# Patient Record
Sex: Female | Born: 1945 | Race: White | Hispanic: No | Smoking: Never smoker
Health system: Southern US, Community
[De-identification: ages and names within clinical notes are randomized; demographics above are authoritative.]

## PROBLEM LIST (undated history)

## (undated) DIAGNOSIS — T7840XA Allergy, unspecified, initial encounter: Secondary | ICD-10-CM

## (undated) DIAGNOSIS — L570 Actinic keratosis: Secondary | ICD-10-CM

## (undated) DIAGNOSIS — K2960 Other gastritis without bleeding: Secondary | ICD-10-CM

## (undated) DIAGNOSIS — M1990 Unspecified osteoarthritis, unspecified site: Secondary | ICD-10-CM

## (undated) DIAGNOSIS — K219 Gastro-esophageal reflux disease without esophagitis: Secondary | ICD-10-CM

## (undated) DIAGNOSIS — E785 Hyperlipidemia, unspecified: Secondary | ICD-10-CM

## (undated) HISTORY — DX: Gastro-esophageal reflux disease without esophagitis: K21.9

## (undated) HISTORY — DX: Allergy, unspecified, initial encounter: T78.40XA

## (undated) HISTORY — DX: Unspecified osteoarthritis, unspecified site: M19.90

## (undated) HISTORY — DX: Actinic keratosis: L57.0

## (undated) HISTORY — DX: Other gastritis without bleeding: K29.60

## (undated) HISTORY — DX: Hyperlipidemia, unspecified: E78.5

## (undated) HISTORY — PX: EYE SURGERY: SHX253

---

## 2011-08-10 DIAGNOSIS — E559 Vitamin D deficiency, unspecified: Secondary | ICD-10-CM | POA: Insufficient documentation

## 2015-06-30 DIAGNOSIS — H8103 Meniere's disease, bilateral: Secondary | ICD-10-CM | POA: Insufficient documentation

## 2015-06-30 DIAGNOSIS — K219 Gastro-esophageal reflux disease without esophagitis: Secondary | ICD-10-CM | POA: Insufficient documentation

## 2015-06-30 DIAGNOSIS — F418 Other specified anxiety disorders: Secondary | ICD-10-CM | POA: Insufficient documentation

## 2015-06-30 HISTORY — DX: Other specified anxiety disorders: F41.8

## 2016-08-06 DIAGNOSIS — J4520 Mild intermittent asthma, uncomplicated: Secondary | ICD-10-CM | POA: Insufficient documentation

## 2016-09-17 DIAGNOSIS — E7800 Pure hypercholesterolemia, unspecified: Secondary | ICD-10-CM | POA: Insufficient documentation

## 2017-02-10 DIAGNOSIS — M65841 Other synovitis and tenosynovitis, right hand: Secondary | ICD-10-CM | POA: Insufficient documentation

## 2018-10-22 ENCOUNTER — Other Ambulatory Visit: Payer: Self-pay | Admitting: Nurse Practitioner

## 2018-10-22 DIAGNOSIS — Z1231 Encounter for screening mammogram for malignant neoplasm of breast: Secondary | ICD-10-CM

## 2018-10-28 ENCOUNTER — Other Ambulatory Visit: Payer: Self-pay

## 2018-10-28 ENCOUNTER — Ambulatory Visit
Admission: RE | Admit: 2018-10-28 | Discharge: 2018-10-28 | Disposition: A | Discharge status: Home / Self Care | Payer: Medicare Other | Source: Ambulatory Visit | Attending: Nurse Practitioner | Admitting: Nurse Practitioner

## 2018-10-28 DIAGNOSIS — Z1231 Encounter for screening mammogram for malignant neoplasm of breast: Secondary | ICD-10-CM | POA: Diagnosis present

## 2019-03-17 DIAGNOSIS — H905 Unspecified sensorineural hearing loss: Secondary | ICD-10-CM | POA: Insufficient documentation

## 2019-03-17 DIAGNOSIS — H8109 Meniere's disease, unspecified ear: Secondary | ICD-10-CM | POA: Insufficient documentation

## 2019-07-29 ENCOUNTER — Ambulatory Visit (INDEPENDENT_AMBULATORY_CARE_PROVIDER_SITE_OTHER): Payer: Self-pay | Admitting: Dermatology

## 2019-07-29 ENCOUNTER — Other Ambulatory Visit: Payer: Self-pay

## 2019-07-29 DIAGNOSIS — L988 Other specified disorders of the skin and subcutaneous tissue: Secondary | ICD-10-CM

## 2019-07-29 NOTE — Progress Notes (Signed)
   New Patient Visit  Subjective  Shawna Peterson is a 74 y.o. female who presents for the following: Facial Elastosis (patient would like to discuss mid face, and nasolabial crease fillers, and Botox. She has had fillers and Botox many times in the past and would like to establish care at a practice closer to her home. She was last injected with fillers and Botox in August 2020, but is unsure of the names of the products used. She also had a face lift about 9 years ago at the same practice in [place] that did her Botox and fillers.). Patient has had a face lift about 15 years ago by a Psychiatric nurse.   Objective  Well appearing patient in no apparent distress; mood and affect are within normal limits.  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  rhydites of crows feet and frown complex accentuated by facial expression.   Crepey skin of face.   Assessment & Plan  Elastosis of skin Face   Recommend and Discussed:  20 units of Botox in the frown complex 7.5 units each side for the crow's feet 4.0 units each side for the DAO's   For a total of 43 units. Advised patient of cost of $559.00. Will need to re-inject every 3-4 months for optimal results.  Mid face - recommend starting with 1 syringe of Voluma to the B/L mid face, and following up in a few months to see if another syringe is needed.   Oral commisures and nasolabial - recommend Restylane Refyne filler.  Creping of the skin on the face - recommend Halo laser treatment and BBL (pamphlet given for each).  Botox Injection - Face Location: See attached image  Informed consent: Discussed risks (infection, pain, bleeding, bruising, swelling, allergic reaction, paralysis of nearby muscles, eyelid droop, double vision, neck weakness, difficulty breathing, headache, undesirable cosmetic result, and need for additional treatment) and benefits of the procedure, as well as the alternatives.   Informed consent was obtained.  Preparation: The area was cleansed with alcohol.  Procedure Details:  Botox was injected into the dermis with a 30-gauge needle. Pressure applied to any bleeding.   Lot Number:  [id number] Expiration:  04/2022  Total Units Injected:  43  Plan: Patient was instructed to remain upright for 4 hours. Patient was instructed to avoid massaging the face and avoid vigorous exercise for the rest of the day. Tylenol may be used for headache.  Allow 2 weeks before returning to clinic for additional dosing as needed. Patient will call for any problems.   Return in about 1 month (around 08/29/2019) for fillers, recheck Botox.

## 2019-07-29 NOTE — Progress Notes (Signed)
   New Patient Visit  Subjective  Shawna Peterson is a 74 y.o. female who presents for the following: Facial Elastosis (patient would like to discuss mid face, and nasolabial crease fillers, and Botox. She has had fillers and Botox many times in the past and would like to establish care at a practice closer to her home. She was last injected with fillers and Botox in August 2020, but is unsure of the names of the products used. She also had a face lift about 9 years ago at the same practice in [place] that did her Botox and fillers.).  Objective  Well appearing patient in no apparent distress; mood and affect are within normal limits.  A focused examination was performed including face. Relevant physical exam findings are noted in the Assessment and Plan.  Objective  Face: Rhytides and volume loss.   Assessment & Plan  Elastosis of skin Face   Recommend and Discussed:  20 units of Botox in the frown complex 7.5 units each side for the crow's feet 4.0 units each side for the DAO's   For a total of 43 units. Advised patient of cost of $559.00. Will need to re-inject every 3-4 months for optimal results.  Mid face - recommend starting with 1 syringe of Voluma to the B/L mid face, and following up in a few months to see if another syringe is needed.   Oral commisures and nasolabial - recommend Restylane Refyne filler.  Creping of the skin on the face - recommend Halo laser treatment and BBL (pamphlet given for each).  Botox Injection - Face Location: See attached image  Informed consent: Discussed risks (infection, pain, bleeding, bruising, swelling, allergic reaction, paralysis of nearby muscles, eyelid droop, double vision, neck weakness, difficulty breathing, headache, undesirable cosmetic result, and need for additional treatment) and benefits of the procedure, as well as the alternatives.  Informed consent was obtained.  Preparation: The area was cleansed with  alcohol.  Procedure Details:  Botox was injected into the dermis with a 30-gauge needle. Pressure applied to any bleeding.   Lot Number:  [id number] Expiration:  04/2022  Total Units Injected:  43  Plan: Patient was instructed to remain upright for 4 hours. Patient was instructed to avoid massaging the face and avoid vigorous exercise for the rest of the day. Tylenol may be used for headache.  Allow 2 weeks before returning to clinic for additional dosing as needed. Patient will call for any problems.   Return in about 1 month (around 08/29/2019) for fillers, recheck Botox.   Tanya Nones, CMA, am acting as scribe for Sarina Ser, MD .

## 2019-08-11 ENCOUNTER — Ambulatory Visit: Payer: Medicare Other

## 2019-08-26 ENCOUNTER — Ambulatory Visit: Payer: Medicare Other | Admitting: Dermatology

## 2019-09-24 ENCOUNTER — Other Ambulatory Visit: Payer: Self-pay

## 2019-09-24 ENCOUNTER — Ambulatory Visit (INDEPENDENT_AMBULATORY_CARE_PROVIDER_SITE_OTHER): Payer: Medicare Other | Admitting: Dermatology

## 2019-09-24 DIAGNOSIS — D229 Melanocytic nevi, unspecified: Secondary | ICD-10-CM | POA: Diagnosis not present

## 2019-09-24 DIAGNOSIS — Z1283 Encounter for screening for malignant neoplasm of skin: Secondary | ICD-10-CM | POA: Diagnosis not present

## 2019-09-24 DIAGNOSIS — L304 Erythema intertrigo: Secondary | ICD-10-CM

## 2019-09-24 DIAGNOSIS — D1801 Hemangioma of skin and subcutaneous tissue: Secondary | ICD-10-CM

## 2019-09-24 DIAGNOSIS — L570 Actinic keratosis: Secondary | ICD-10-CM | POA: Diagnosis not present

## 2019-09-24 DIAGNOSIS — L820 Inflamed seborrheic keratosis: Secondary | ICD-10-CM

## 2019-09-24 DIAGNOSIS — L821 Other seborrheic keratosis: Secondary | ICD-10-CM

## 2019-09-24 DIAGNOSIS — L814 Other melanin hyperpigmentation: Secondary | ICD-10-CM

## 2019-09-24 DIAGNOSIS — L578 Other skin changes due to chronic exposure to nonionizing radiation: Secondary | ICD-10-CM

## 2019-09-24 NOTE — Patient Instructions (Addendum)

## 2019-09-24 NOTE — Progress Notes (Signed)
Follow-Up Visit   Subjective  Shawna Peterson is a 74 y.o. female who presents for the following: Mole check (Total body skin exam, no hx of skin ca), check growths (face, neck, itchy and irritated my jewelry), and growths (under breast, uses otc HC). Patient presents for total body skin exam for skin cancer screening and mole check.  The following portions of the chart were reviewed this encounter and updated as appropriate:  Allergies  Meds  Problems  Med Hx  Surg Hx  Fam Hx      Review of Systems:  No other skin or systemic complaints except as noted in HPI or Assessment and Plan.  Objective  Well appearing patient in no apparent distress; mood and affect are within normal limits.  A full examination was performed including scalp, head, eyes, ears, nose, lips, neck, chest, axillae, abdomen, back, buttocks, bilateral upper extremities, bilateral lower extremities, hands, feet, fingers, toes, fingernails, and toenails. All findings within normal limits unless otherwise noted below.  Objective  R cheek x 2 (2): Pink scaly macules   Objective  L cheek/Neck/chest/back/R breast/scalp x 20 (20): Erythematous keratotic or waxy stuck-on papule or plaque.   Objective  inframammary: Pinkness    Assessment & Plan  AK (actinic keratosis) (2) R cheek x 2  Destruction of lesion - R cheek x 2 Complexity: simple   Destruction method: cryotherapy   Informed consent: discussed and consent obtained   Timeout:  patient name, date of birth, surgical site, and procedure verified Lesion destroyed using liquid nitrogen: Yes   Region frozen until ice ball extended beyond lesion: Yes   Outcome: patient tolerated procedure well with no complications   Post-procedure details: wound care instructions given    Inflamed seborrheic keratosis (20) L cheek/Neck/chest/back/R breast/scalp x 20  Destruction of lesion - L cheek/Neck/chest/back/R breast/scalp x 20 Complexity: simple     Destruction method: cryotherapy   Informed consent: discussed and consent obtained   Timeout:  patient name, date of birth, surgical site, and procedure verified Lesion destroyed using liquid nitrogen: Yes   Region frozen until ice ball extended beyond lesion: Yes   Outcome: patient tolerated procedure well with no complications   Post-procedure details: wound care instructions given    Intertrigo inframammary  Cont OTC HC cream qd up to 4d/wk prn flares  Skin cancer screening   Lentigines - Scattered tan macules - Discussed due to sun exposure - Benign, observe - Call for any changes  Seborrheic Keratoses - Stuck-on, waxy, tan-brown papules and plaques  - Discussed benign etiology and prognosis. - Observe - Call for any changes - Discussed cosmetic treatment for arms, Ln2 and chemical peels  Melanocytic Nevi - Tan-brown and/or pink-flesh-colored symmetric macules and papules - Benign appearing on exam today - Observation - Call clinic for new or changing moles - Recommend daily use of broad spectrum spf 30+ sunscreen to sun-exposed areas.   Hemangiomas - Red papules - Discussed benign nature - Observe - Call for any changes  Actinic Damage - diffuse scaly erythematous macules with underlying dyspigmentation - Recommend daily broad spectrum sunscreen SPF 30+ to sun-exposed areas, reapply every 2 hours as needed.  - Call for new or changing lesions.  Skin cancer screening performed today.   Return in about 2 months (around 11/24/2019) for recheck AKs, ISKs and Botox.   I, Othelia Pulling, RMA, am acting as scribe for Sarina Ser, MD .  Documentation: I have reviewed the above documentation for accuracy and completeness,  and I agree with the above.  Sarina Ser, MD

## 2019-09-27 ENCOUNTER — Encounter: Payer: Self-pay | Admitting: Dermatology

## 2019-12-01 ENCOUNTER — Ambulatory Visit: Payer: Medicare Other | Admitting: Dermatology

## 2020-02-03 DIAGNOSIS — K824 Cholesterolosis of gallbladder: Secondary | ICD-10-CM | POA: Insufficient documentation

## 2020-02-03 DIAGNOSIS — K862 Cyst of pancreas: Secondary | ICD-10-CM | POA: Insufficient documentation

## 2020-05-13 ENCOUNTER — Other Ambulatory Visit: Payer: Self-pay

## 2020-05-13 ENCOUNTER — Ambulatory Visit (LOCAL_COMMUNITY_HEALTH_CENTER): Payer: Self-pay

## 2020-05-13 DIAGNOSIS — Z111 Encounter for screening for respiratory tuberculosis: Secondary | ICD-10-CM

## 2020-05-16 ENCOUNTER — Ambulatory Visit (LOCAL_COMMUNITY_HEALTH_CENTER): Payer: Medicare Other

## 2020-05-16 ENCOUNTER — Other Ambulatory Visit: Payer: Self-pay

## 2020-05-16 DIAGNOSIS — Z111 Encounter for screening for respiratory tuberculosis: Secondary | ICD-10-CM

## 2020-05-16 LAB — TB SKIN TEST
Induration: 0 mm
TB Skin Test: NEGATIVE

## 2020-07-12 LAB — COLOGUARD: COLOGUARD: NEGATIVE

## 2020-07-29 ENCOUNTER — Ambulatory Visit: Payer: Self-pay | Admitting: Internal Medicine

## 2020-10-12 ENCOUNTER — Ambulatory Visit: Payer: Medicare Other | Admitting: Internal Medicine

## 2020-11-02 ENCOUNTER — Other Ambulatory Visit: Payer: Self-pay | Admitting: Internal Medicine

## 2020-11-02 DIAGNOSIS — Z1231 Encounter for screening mammogram for malignant neoplasm of breast: Secondary | ICD-10-CM

## 2020-11-04 ENCOUNTER — Other Ambulatory Visit (HOSPITAL_COMMUNITY): Payer: Self-pay | Admitting: Internal Medicine

## 2020-11-16 ENCOUNTER — Ambulatory Visit: Payer: Medicare Other

## 2020-11-24 ENCOUNTER — Other Ambulatory Visit: Payer: Self-pay

## 2020-11-24 ENCOUNTER — Ambulatory Visit
Admission: RE | Admit: 2020-11-24 | Discharge: 2020-11-24 | Disposition: A | Discharge status: Home / Self Care | Payer: Medicare Other | Source: Ambulatory Visit | Attending: Internal Medicine | Admitting: Internal Medicine

## 2020-11-24 DIAGNOSIS — I700 Atherosclerosis of aorta: Secondary | ICD-10-CM | POA: Insufficient documentation

## 2020-11-24 DIAGNOSIS — I2510 Atherosclerotic heart disease of native coronary artery without angina pectoris: Secondary | ICD-10-CM | POA: Insufficient documentation

## 2020-11-24 DIAGNOSIS — R911 Solitary pulmonary nodule: Secondary | ICD-10-CM | POA: Insufficient documentation

## 2020-11-30 DIAGNOSIS — I700 Atherosclerosis of aorta: Secondary | ICD-10-CM | POA: Insufficient documentation

## 2020-11-30 DIAGNOSIS — I2510 Atherosclerotic heart disease of native coronary artery without angina pectoris: Secondary | ICD-10-CM | POA: Insufficient documentation

## 2021-02-21 ENCOUNTER — Other Ambulatory Visit: Payer: Self-pay

## 2021-02-21 ENCOUNTER — Ambulatory Visit
Admission: RE | Admit: 2021-02-21 | Discharge: 2021-02-21 | Disposition: A | Discharge status: Home / Self Care | Payer: Medicare Other | Source: Ambulatory Visit | Attending: Internal Medicine | Admitting: Internal Medicine

## 2021-02-21 DIAGNOSIS — Z1231 Encounter for screening mammogram for malignant neoplasm of breast: Secondary | ICD-10-CM | POA: Insufficient documentation

## 2021-06-03 ENCOUNTER — Ambulatory Visit (INDEPENDENT_AMBULATORY_CARE_PROVIDER_SITE_OTHER)
Admission: RE | Admit: 2021-06-03 | Discharge: 2021-06-03 | Disposition: A | Discharge status: Home / Self Care | Payer: Medicare Other | Source: Ambulatory Visit | Attending: Emergency Medicine | Admitting: Emergency Medicine

## 2021-06-03 ENCOUNTER — Other Ambulatory Visit: Payer: Self-pay

## 2021-06-03 ENCOUNTER — Ambulatory Visit
Admission: RE | Admit: 2021-06-03 | Discharge: 2021-06-03 | Disposition: A | Discharge status: Home / Self Care | Payer: Medicare Other | Source: Ambulatory Visit

## 2021-06-03 VITALS — BP 128/70 | HR 107 | Temp 98.0°F

## 2021-06-03 DIAGNOSIS — K529 Noninfective gastroenteritis and colitis, unspecified: Secondary | ICD-10-CM | POA: Diagnosis not present

## 2021-06-03 DIAGNOSIS — J069 Acute upper respiratory infection, unspecified: Secondary | ICD-10-CM

## 2021-06-03 DIAGNOSIS — R109 Unspecified abdominal pain: Secondary | ICD-10-CM

## 2021-06-03 LAB — COMPREHENSIVE METABOLIC PANEL
ALT: 16 U/L (ref 0–44)
AST: 19 U/L (ref 15–41)
Albumin: 4 g/dL (ref 3.5–5.0)
Alkaline Phosphatase: 72 U/L (ref 38–126)
Anion gap: 10 (ref 5–15)
BUN: 17 mg/dL (ref 8–23)
CO2: 22 mmol/L (ref 22–32)
Calcium: 9.2 mg/dL (ref 8.9–10.3)
Chloride: 104 mmol/L (ref 98–111)
Creatinine, Ser: 0.72 mg/dL (ref 0.44–1.00)
GFR, Estimated: >60 mL/min (ref >60)
Glucose, Bld: 107 mg/dL — ABNORMAL HIGH (ref 70–99)
Potassium: 3.7 mmol/L (ref 3.5–5.1)
Sodium: 136 mmol/L (ref 135–145)
Total Bilirubin: 0.8 mg/dL (ref 0.3–1.2)
Total Protein: 7.3 g/dL (ref 6.5–8.1)

## 2021-06-03 LAB — CBC WITH DIFFERENTIAL/PLATELET
Abs Immature Granulocytes: 0.01 10*3/uL (ref 0.00–0.07)
Basophils Absolute: 0 10*3/uL (ref 0.0–0.1)
Basophils Relative: 0 %
Eosinophils Absolute: 0.2 10*3/uL (ref 0.0–0.5)
Eosinophils Relative: 4 %
HCT: 41.5 % (ref 36.0–46.0)
Hemoglobin: 14.8 g/dL (ref 12.0–15.0)
Immature Granulocytes: 0 %
Lymphocytes Relative: 23 %
Lymphs Abs: 1.1 10*3/uL (ref 0.7–4.0)
MCH: 33.3 pg (ref 26.0–34.0)
MCHC: 35.7 g/dL (ref 30.0–36.0)
MCV: 93.5 fL (ref 80.0–100.0)
Monocytes Absolute: 0.7 10*3/uL (ref 0.1–1.0)
Monocytes Relative: 13 %
Neutro Abs: 2.9 10*3/uL (ref 1.7–7.7)
Neutrophils Relative %: 60 %
Platelets: 241 10*3/uL (ref 150–400)
RBC: 4.44 MIL/uL (ref 3.87–5.11)
RDW: 12.1 % (ref 11.5–15.5)
WBC: 4.9 10*3/uL (ref 4.0–10.5)
nRBC: 0 % (ref 0.0–0.2)

## 2021-06-03 LAB — LIPASE, BLOOD: Lipase: 33 U/L (ref 11–51)

## 2021-06-03 MED ORDER — ONDANSETRON 8 MG PO TBDP: 8.0000 mg | ORAL_TABLET | Freq: Three times a day (TID) | ORAL | 0 refills | Status: DC | PRN

## 2021-06-03 MED ORDER — DICYCLOMINE HCL 20 MG PO TABS: 20.0000 mg | ORAL_TABLET | Freq: Two times a day (BID) | ORAL | 0 refills | Status: DC

## 2021-06-03 MED ORDER — IPRATROPIUM BROMIDE 0.06 % NA SOLN: 2.0000 | Freq: Four times a day (QID) | NASAL | 12 refills | Status: AC

## 2021-06-03 NOTE — ED Triage Notes (Signed)
Pt c/o diarrhea for 3 days. She has been taking imodium with some relief. Feels nauseated after eating. Pt also c/o sinus congestion with thick green phelgm x 1 week.

## 2021-06-03 NOTE — ED Provider Notes (Signed)
[facility]    CSN: [id number] Arrival date & time: 06/03/21  0947      History   Chief Complaint Chief Complaint  Patient presents with   Diarrhea    HPI Shawna Peterson is a 76 y.o. female.   HPI  28 old female here for evaluation of multiple complaints.  Patient's prime complaint is that she has been experiencing diarrhea for the past 3 days that is associated with left upper quadrant pain.  She states that when her diarrhea started she had 7-8 stools that were yellow liquid.  She denies any blood in her stools.  The last 2 days were 4 stools a day of the same consistency and variety.  Today she has not had any stools.  She has been using Imodium at home to help alleviate symptoms.  She is a retired Marine scientist.  Additionally, for the past week she has been experiencing sinus congestion with green postnasal drip.  She is also been experiencing left ear pain.  Patient denies any fever or vomiting.  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  She also a cyst on her liver at that time.  Her skin is due to be free done for further evaluation.  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  She does have her gallbladder still.  She states that the pain increases with eating but she is had a decreased appetite.  The pain increases in the left upper quadrant.  Patient states that she does drink wine but she has not consumed any recently due to her pain.  History reviewed. No pertinent past medical history.  There are no problems to display for this patient.   History reviewed. No pertinent surgical history.  OB History   No obstetric history on file.      Home Medications    Prior to Admission medications   Medication Sig Start Date End Date Taking? Authorizing Provider  dicyclomine (BENTYL) 20 MG tablet Take 1 tablet (20 mg total) by mouth 2 (two) times daily.  06/03/21  Yes Margarette Canada, NP  ipratropium (ATROVENT) 0.06 % nasal spray Place 2 sprays into both nostrils 4 (four) times daily. 06/03/21  Yes Margarette Canada, NP  lansoprazole (PREVACID) 30 MG capsule Take 30 mg by mouth daily at 12 noon.   Yes [provider]  ondansetron (ZOFRAN-ODT) 8 MG disintegrating tablet Take 1 tablet (8 mg total) by mouth every 8 (eight) hours as needed for nausea or vomiting. 06/03/21  Yes Margarette Canada, NP  diphenhydrAMINE (BENADRYL ALLERGY) 25 mg capsule Benadryl    [provider]  fexofenadine (ALLEGRA ALLERGY) 180 MG tablet Allegra Allergy    [provider]  fluticasone (FLONASE) 50 MCG/ACT nasal spray Flonase    [provider]  Loratadine (CLARITIN) 10 MG CAPS Claritin    [provider]  Multiple Vitamin (MULTIVITAMIN) tablet Take 1 tablet by mouth daily.    [provider]  valACYclovir (VALTREX) 500 MG tablet Take 500 mg by mouth 2 (two) times daily.    [provider]    Family History Family History  Problem Relation Age of Onset   Breast cancer Maternal Aunt     Social History Social History   Tobacco Use   Smoking status: Never   Smokeless tobacco: Never  Vaping Use   Vaping Use: Never used  Substance Use Topics  Alcohol use: Yes   Drug use: Never     Allergies   Lorazepam   Review of Systems Review of Systems  Constitutional:  Positive for appetite change. Negative for fever.  HENT:  Positive for congestion, ear pain, postnasal drip and sinus pressure. Negative for ear discharge and sore throat.   Respiratory:  Negative for cough.   Gastrointestinal:  Positive for abdominal pain, diarrhea and nausea. Negative for blood in stool and vomiting.  Skin:  Negative for color change and rash.  Hematological: Negative.   Psychiatric/Behavioral: Negative.      Physical Exam Triage Vital Signs ED Triage Vitals  Enc Vitals Group     BP 06/03/21 0956 128/70     Pulse Rate  06/03/21 0956 (!) 107     Resp --      Temp 06/03/21 0956 98 F (36.7 C)     Temp Source 06/03/21 0956 Oral     SpO2 06/03/21 0956 100 %     Weight --      Height --      Head Circumference --      Peak Flow --      Pain Score 06/03/21 0957 2     Pain Loc --      Pain Edu? --      Excl. in [place]? --    No data found.  Updated Vital Signs BP 128/70 (BP Location: Left Arm)    Pulse (!) 107    Temp 98 F (36.7 C) (Oral)    SpO2 100%   Visual Acuity Right Eye Distance:   Left Eye Distance:   Bilateral Distance:    Right Eye Near:   Left Eye Near:    Bilateral Near:     Physical Exam Vitals and nursing note reviewed.  Constitutional:      General: She is not in acute distress.    Appearance: Normal appearance. She is not ill-appearing.  HENT:     Head: Normocephalic and atraumatic.     Right Ear: Tympanic membrane, ear canal and external ear normal. There is no impacted cerumen.     Left Ear: Tympanic membrane, ear canal and external ear normal. There is no impacted cerumen.     Nose: Congestion present. No rhinorrhea.     Mouth/Throat:     Mouth: Mucous membranes are moist.     Pharynx: Oropharynx is clear. No posterior oropharyngeal erythema.  Cardiovascular:     Rate and Rhythm: Normal rate and regular rhythm.     Pulses: Normal pulses.     Heart sounds: Normal heart sounds. No murmur heard.   No friction rub. No gallop.  Pulmonary:     Effort: Pulmonary effort is normal.     Breath sounds: Normal breath sounds. No wheezing, rhonchi or rales.  Abdominal:     General: Abdomen is flat.     Palpations: Abdomen is soft.     Tenderness: There is abdominal tenderness. There is no guarding or rebound.  Musculoskeletal:     Cervical back: Normal range of motion and neck supple.  Lymphadenopathy:     Cervical: No cervical adenopathy.  Skin:    General: Skin is warm and dry.     Capillary Refill: Capillary refill takes less than 2 seconds.     Coloration: Skin is not  jaundiced.  Neurological:     General: No focal deficit present.     Mental Status: She is alert and oriented to person, place, and time.  Psychiatric:        Mood and Affect: Mood normal.        Behavior: Behavior normal.        Thought Content: Thought content normal.        Judgment: Judgment normal.     UC Treatments / Results  Labs (all labs ordered are listed, but only abnormal results are displayed) Labs Reviewed  COMPREHENSIVE METABOLIC PANEL - Abnormal; Notable for the following components:      Result Value   Glucose, Bld 107 (*)    All other components within normal limits  CBC WITH DIFFERENTIAL/PLATELET  LIPASE, BLOOD    EKG   Radiology CT ABDOMEN PELVIS WO CONTRAST  Result Date: 06/03/2021 CLINICAL DATA:  Concern for pancreatitis EXAM: CT ABDOMEN AND PELVIS WITHOUT CONTRAST TECHNIQUE: Multidetector CT imaging of the abdomen and pelvis was performed following the standard protocol without IV contrast. RADIATION DOSE REDUCTION: This exam was performed according to the departmental dose-optimization program which includes automated exposure control, adjustment of the mA and/or kV according to patient size and/or use of iterative reconstruction technique. COMPARISON:  None. FINDINGS: Lower chest: No acute abnormality. Hepatobiliary: Hypodense 5 mm lesion left lobe of the liver on image 21/2 is technically too small to accurately characterize and incompletely evaluated without intravenous contrast material but statistically likely to reflect a benign etiology such as a cyst or hemangioma. Gallbladder is unremarkable. No biliary ductal dilation. Pancreas: No pancreatic ductal dilation or evidence of acute inflammation. Spleen: Normal in size without focal abnormality. Adrenals/Urinary Tract: Bilateral adrenal glands appear normal. Hypodense 8 mm right upper pole renal lesion is too small to accurately characterize and incompletely evaluated without intravenous contrast material  but statistically likely reflect cysts. No hydronephrosis. No renal, ureteral or bladder calculi identified. Urinary bladder is decompressed limiting evaluation. Stomach/Bowel: No enteric contrast was administered. Stomach is distended with ingested material without wall thickening. No pathologic dilation of small or large bowel. The appendix and terminal ileum appear normal. No evidence of acute bowel inflammation. No suspicious colonic wall thickening or mass like lesions visualized. No evidence of acute bowel inflammation. Vascular/Lymphatic: Aortic atherosclerosis without aneurysmal dilation. No pathologically enlarged abdominal or pelvic lymph nodes. Reproductive: Uterus and bilateral adnexa are unremarkable. Other: No significant abdominopelvic free fluid. Musculoskeletal: Multilevel degenerative changes spine. No acute osseous abnormality. IMPRESSION: 1. No acute abdominopelvic findings. Specifically, no CT evidence of pancreatitis. 2.  Aortic Atherosclerosis (ICD10-I70.0). Electronically Signed   By: Dahlia Bailiff M.D.   On: 06/03/2021 11:09    Procedures Procedures (including critical care time)  Medications Ordered in UC Medications - No data to display  Initial Impression / Assessment and Plan / UC Course  I have reviewed the triage vital signs and the nursing notes.  Pertinent labs & imaging results that were available during my care of the patient were reviewed by me and considered in my medical decision making (see chart for details).  Patient is a nontoxic-appearing 64 old female here for evaluation of respiratory and abdominal complaints as outlined HPI above.  Her more pressing complaint is her left upper quadrant pain and associated diarrhea.  The diarrhea seems to have resolved and she has not had a stool today but the pain remains.  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  She does not have any yellowing of her skin or  yellowing of her eyes.  She states that the pain does increase  with eating and increases in the left upper quadrant.  Patient is a concern for pancreatitis because she also has high cholesterol and cholesterol ptosis of her gallbladder.  She does consume wine but has not consumed any due to feeling bad.  Her other symptoms are sinus congestion, green postnasal drip that been present for the past week.  She has been taking Imodium at home and using Gatorade for electrolyte replacement.  Her physical exam reveals pearly-gray tympanic membranes bilaterally with normal light reflex and clear external auditory canals.  Nasal mucosa is mildly erythematous without significant edema.  There is dried green discharge in both nares.  Patient does have marked tenderness of bilateral maxillary sinuses to percussion.  Oropharyngeal exam is benign.  No cervical lymphadenopathy appreciated exam.  Cardiopulmonary exam reveals clear lung sounds in all fields.  Abdomen is soft and flat with significant tenderness in the epigastric and left upper quadrant.  Patient does have some mild tenderness in the left lower quadrant as well.  No suprapubic or right lower quadrant pain noted.  No red upper quadrant pain noted.  No guarding or rebound noted on exam.  Differential diagnosis include pancreatitis and diverticulitis.  Suspect patient does have upper respiratory infection, possibly sinusitis given the tenderness and discharge.  Her postnasal drip may be contributing to the diarrhea as well.  Will obtain CBC, CMP, lipase, and CT of the abdomen and pelvis with water contrast per radiology protocol for evaluation of the pancreas.  CBC is unremarkable.  CMP shows a mildly elevated glucose of 107 but otherwise no abnormality of electrolytes, renal function, or transaminases.  Lipase 33.  CT scan of the abdomen is negative for acute abdominal pelvic findings.  No CT evidence of pancreatitis.  Aortic atherosclerosis.  Will discharge  patient home with a diagnosis of gastroenteritis and have her continue the Imodium as needed for diarrhea.  We will also start dicyclomine.  I will encourage her to perform sinus irrigation 45 sinus inflammation and pain.  I have also prescribed Atrovent nasal spray to help with the nasal congestion.  Also prescribe Zofran for nausea.  Final Clinical Impressions(s) / UC Diagnoses   Final diagnoses:  Gastroenteritis  Acute upper respiratory infection     Discharge Instructions      Take the Zofran every 8 hours as needed for nausea and vomiting.  They are an oral disintegrating tablet and you can place them on her under your tongue and then will be absorbed.  Use the Bentyl (dicyclomine) every 6 hours as needed for abdominal cramping.  Follow a clear liquid diet for the next 6 to 12 hours.  Clear liquids consist of broth, ginger ale, water, Pedialyte, and Jell-O.  After 6 to 12 hours, if you are tolerating clear liquids, you can advance to bland foods such as bananas, rice, applesauce, and toast.  If you tolerate bland foods you can continue to advance your diet as you see fit.  If you develop a fever over 100.5, increased abdominal pain, bloody vomit, or bloody stool return for reevaluation or go to the ER.   Perform sinus irrigation 2-3 times a day with a [employer] sinus rinse kit and distilled water.  Do not use tap water.  You can use plain over-the-counter Mucinex every 6 hours to break up the stickiness of the mucus so your body can clear it.  Increase your oral fluid intake to thin out your mucus so that is also able for your body to clear more  easily.  Take an over-the-counter probiotic, such as Culturelle-align-activia, 1 hour after each dose of antibiotic to prevent diarrhea.  If you develop any new or worsening symptoms return for reevaluation or see your primary care provider.      ED Prescriptions     Medication Sig Dispense Auth. Provider   ipratropium (ATROVENT)  0.06 % nasal spray Place 2 sprays into both nostrils 4 (four) times daily. 15 mL Margarette Canada, NP   dicyclomine (BENTYL) 20 MG tablet Take 1 tablet (20 mg total) by mouth 2 (two) times daily. 20 tablet Margarette Canada, NP   ondansetron (ZOFRAN-ODT) 8 MG disintegrating tablet Take 1 tablet (8 mg total) by mouth every 8 (eight) hours as needed for nausea or vomiting. 20 tablet Margarette Canada, NP      PDMP not reviewed this encounter.   Margarette Canada, NP 06/03/21 1119

## 2021-06-03 NOTE — Discharge Instructions (Addendum)
Take the Zofran every 8 hours as needed for nausea and vomiting.  They are an oral disintegrating tablet and you can place them on her under your tongue and then will be absorbed.  Use the Bentyl (dicyclomine) every 6 hours as needed for abdominal cramping.  Follow a clear liquid diet for the next 6 to 12 hours.  Clear liquids consist of broth, ginger ale, water, Pedialyte, and Jell-O.  After 6 to 12 hours, if you are tolerating clear liquids, you can advance to bland foods such as bananas, rice, applesauce, and toast.  If you tolerate bland foods you can continue to advance your diet as you see fit.  If you develop a fever over 100.5, increased abdominal pain, bloody vomit, or bloody stool return for reevaluation or go to the ER.   Perform sinus irrigation 2-3 times a day with a [employer] sinus rinse kit and distilled water.  Do not use tap water.  You can use plain over-the-counter Mucinex every 6 hours to break up the stickiness of the mucus so your body can clear it.  Increase your oral fluid intake to thin out your mucus so that is also able for your body to clear more easily.  Take an over-the-counter probiotic, such as Culturelle-align-activia, 1 hour after each dose of antibiotic to prevent diarrhea.  If you develop any new or worsening symptoms return for reevaluation or see your primary care provider.

## 2021-06-06 ENCOUNTER — Ambulatory Visit (INDEPENDENT_AMBULATORY_CARE_PROVIDER_SITE_OTHER): Payer: Medicare Other | Admitting: Family Medicine

## 2021-06-06 ENCOUNTER — Other Ambulatory Visit: Payer: Self-pay

## 2021-06-06 ENCOUNTER — Encounter: Payer: Self-pay | Admitting: Family Medicine

## 2021-06-06 VITALS — BP 120/60 | HR 80 | Ht 60.0 in | Wt 114.0 lb

## 2021-06-06 DIAGNOSIS — Z7689 Persons encountering health services in other specified circumstances: Secondary | ICD-10-CM

## 2021-06-06 DIAGNOSIS — F5101 Primary insomnia: Secondary | ICD-10-CM | POA: Diagnosis not present

## 2021-06-06 DIAGNOSIS — J0100 Acute maxillary sinusitis, unspecified: Secondary | ICD-10-CM

## 2021-06-06 DIAGNOSIS — E7800 Pure hypercholesterolemia, unspecified: Secondary | ICD-10-CM | POA: Diagnosis not present

## 2021-06-06 DIAGNOSIS — I700 Atherosclerosis of aorta: Secondary | ICD-10-CM

## 2021-06-06 MED ORDER — TRAZODONE HCL 50 MG PO TABS
25.0000 mg | ORAL_TABLET | Freq: Every evening | ORAL | 3 refills | Status: DC | PRN
Start: 2021-06-06 — End: 2021-06-29

## 2021-06-06 MED ORDER — AZITHROMYCIN 250 MG PO TABS: ORAL_TABLET | ORAL | 0 refills | Status: AC

## 2021-06-06 MED ORDER — EZETIMIBE 10 MG PO TABS: 10.0000 mg | ORAL_TABLET | Freq: Every day | ORAL | 2 refills | Status: DC

## 2021-06-06 NOTE — Patient Instructions (Signed)

## 2021-06-06 NOTE — Progress Notes (Signed)
Date:  06/06/2021   Name:  Shawna Peterson   DOB:  [birth year]   MRN:  [id number]   Chief Complaint: [place] and Sinusitis [employer] production)  Patient is a 76 year old female who presents for a comprehensive physical exam. The patient reports the following problems: multiple. Health maintenance has been reviewed up to date    Sinusitis This is a new problem. The current episode started more than 1 month ago. The problem has been waxing and waning since onset. There has been no fever. The pain is moderate. Associated symptoms include congestion, sinus pressure, sneezing and a sore throat. Pertinent negatives include no chills, diaphoresis, ear pain, headaches, hoarse voice, neck pain, shortness of breath or swollen glands. Past treatments include nothing. The treatment provided moderate relief.  Hyperlipidemia This is a chronic problem. The problem is uncontrolled. Recent lipid tests were reviewed and are high. Pertinent negatives include no shortness of breath. Current antihyperlipidemic treatment includes diet change. Compliance problems include medication side effects.    Lab Results  Component Value Date   NA 136 06/03/2021   K 3.7 06/03/2021   CO2 22 06/03/2021   GLUCOSE 107 (H) 06/03/2021   BUN 17 06/03/2021   CREATININE 0.72 06/03/2021   CALCIUM 9.2 06/03/2021   GFRNONAA >60 06/03/2021   No results found for: CHOL, HDL, LDLCALC, LDLDIRECT, TRIG, CHOLHDL No results found for: TSH No results found for: HGBA1C Lab Results  Component Value Date   WBC 4.9 06/03/2021   HGB 14.8 06/03/2021   HCT 41.5 06/03/2021   MCV 93.5 06/03/2021   PLT 241 06/03/2021   Lab Results  Component Value Date   ALT 16 06/03/2021   AST 19 06/03/2021   ALKPHOS 72 06/03/2021   BILITOT 0.8 06/03/2021   No results found for: 25OHVITD2, 25OHVITD3, VD25OH   Review of Systems  Constitutional:  Negative for chills and diaphoresis.  HENT:  Positive for congestion, sinus pressure, sneezing  and sore throat. Negative for ear pain and hoarse voice.   Respiratory:  Negative for shortness of breath.   Musculoskeletal:  Negative for neck pain.  Neurological:  Negative for headaches.   There are no problems to display for this patient.   Allergies  Allergen Reactions   Lorazepam Nausea Only and Other (See Comments)    Other Reaction: Nightmares Other Reaction: Nightmares Other Reaction: Nightmares Other Reaction: Nightmares Other Reaction: Nightmares Other Reaction: Nightmares     No past surgical history on file.  Social History   Tobacco Use   Smoking status: Never   Smokeless tobacco: Never  Vaping Use   Vaping Use: Never used  Substance Use Topics   Alcohol use: Yes    Comment: occasionally   Drug use: Never     Medication list has been reviewed and updated.  Current Meds  Medication Sig   ALPRAZolam (XANAX) 0.25 MG tablet Take 1 tablet by mouth daily.   Cholecalciferol (VITAMIN D3) 125 MCG (5000 UT) CAPS Take 1 capsule by mouth daily.   dicyclomine (BENTYL) 20 MG tablet Take 1 tablet (20 mg total) by mouth 2 (two) times daily.   diphenhydrAMINE (BENADRYL) 25 mg capsule Benadryl   fexofenadine (ALLEGRA) 180 MG tablet Allegra Allergy   fluticasone (FLONASE) 50 MCG/ACT nasal spray Flonase   ipratropium (ATROVENT) 0.06 % nasal spray Place 2 sprays into both nostrils 4 (four) times daily.   lansoprazole (PREVACID) 30 MG capsule Take 30 mg by mouth daily at 12 noon.  Loratadine 10 MG CAPS Claritin   Multiple Vitamin (MULTIVITAMIN) tablet Take 1 tablet by mouth daily.   ondansetron (ZOFRAN-ODT) 8 MG disintegrating tablet Take 1 tablet (8 mg total) by mouth every 8 (eight) hours as needed for nausea or vomiting.   valACYclovir (VALTREX) 500 MG tablet Take 500 mg by mouth 2 (two) times daily.    PHQ 2/9 Scores 06/06/2021  PHQ - 2 Score 1  PHQ- 9 Score 3    GAD 7 : Generalized Anxiety Score 06/06/2021  Nervous, Anxious, on Edge 1  Control/stop worrying 1   Worry too much - different things 1  Trouble relaxing 0  Restless 0  Easily annoyed or irritable 0  Afraid - awful might happen 0  Total GAD 7 Score 3  Anxiety Difficulty Not difficult at all    BP Readings from Last 3 Encounters:  06/06/21 120/60  06/03/21 128/70    Physical Exam Vitals and nursing note reviewed.  Constitutional:      Appearance: She is well-developed.  HENT:     Head: Normocephalic.     Right Ear: Tympanic membrane and external ear normal.     Left Ear: Tympanic membrane and external ear normal.     Nose: Nose normal.  Eyes:     General: Lids are everted, no foreign bodies appreciated. No scleral icterus.       Left eye: No foreign body or hordeolum.     Conjunctiva/sclera: Conjunctivae normal.     Right eye: Right conjunctiva is not injected.     Left eye: Left conjunctiva is not injected.     Pupils: Pupils are equal, round, and reactive to light.  Neck:     Thyroid: No thyromegaly.     Vascular: No JVD.     Trachea: No tracheal deviation.  Cardiovascular:     Rate and Rhythm: Normal rate and regular rhythm.     Heart sounds: Normal heart sounds. No murmur heard.   No friction rub. No gallop.  Pulmonary:     Effort: Pulmonary effort is normal. No respiratory distress.     Breath sounds: Normal breath sounds. No wheezing, rhonchi or rales.  Abdominal:     General: Bowel sounds are normal.     Palpations: Abdomen is soft. There is no mass.     Tenderness: There is no abdominal tenderness. There is no guarding or rebound.  Musculoskeletal:        General: No tenderness. Normal range of motion.     Cervical back: Normal range of motion and neck supple.  Lymphadenopathy:     Cervical: No cervical adenopathy.  Skin:    General: Skin is warm.     Findings: No rash.  Neurological:     Mental Status: She is alert and oriented to person, place, and time.  Psychiatric:        Mood and Affect: Mood is not anxious or depressed.    Wt Readings from  Last 3 Encounters:  06/06/21 114 lb (51.7 kg)    BP 120/60    Pulse 80    Ht 5' (1.524 m)    Wt 114 lb (51.7 kg)    BMI 22.26 kg/m   Assessment and Plan:  1. Establishing care with new doctor, encounter for Patient establishing care with new physician with the following circumstances to be addressed today.  2. Pure hypercholesterolemia Patient has had a history of abnormal cholesterol for which she has been unable to tolerate statins presumably was on  low-dose Crestor?  5 mg daily.  We have discussed using Zetia 10 mg once a day which is not a statin and she would like to give that a try.  Patient will return in 8 weeks fasting at which time we will recheck lipid panel. - ezetimibe (ZETIA) 10 MG tablet; Take 1 tablet (10 mg total) by mouth daily.  Dispense: 30 tablet; Refill: 2  3. Primary insomnia Noted that patient has history of insomnia for which she was taking alprazolam but we would prefer not to continue benzodiazepines for this circumstance.  We are willing to try trazodone 50 mg 1/2 to 1 tablet at bedtime as needed for sleep.  Patient will return in 8 weeks to discuss continuance. - traZODone (DESYREL) 50 MG tablet; Take 0.5-1 tablets (25-50 mg total) by mouth at bedtime as needed for sleep.  Dispense: 30 tablet; Refill: 3  4. 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  5. Acute non-recurrent maxillary sinusitis New onset.  Persistent.  Patient continues to have symptomatology suggestive of sinus infection.  Conservative means have been used but there is no change in his symptomatology and we will now initiate azithromycin to 50 mg 2 today followed by 1 a day for 4 days. - azithromycin (ZITHROMAX) 250 MG tablet; Take 2 tablets on day 1, then 1 tablet daily on days 2 through 5  Dispense: 6 tablet; Refill: 0

## 2021-06-07 ENCOUNTER — Ambulatory Visit: Payer: Medicare Other

## 2021-06-17 ENCOUNTER — Other Ambulatory Visit: Payer: Self-pay | Admitting: Family Medicine

## 2021-06-17 DIAGNOSIS — E7800 Pure hypercholesterolemia, unspecified: Secondary | ICD-10-CM

## 2021-06-19 ENCOUNTER — Other Ambulatory Visit: Payer: Self-pay

## 2021-06-19 ENCOUNTER — Encounter: Payer: Self-pay | Admitting: Family Medicine

## 2021-06-19 DIAGNOSIS — R197 Diarrhea, unspecified: Secondary | ICD-10-CM

## 2021-06-19 MED ORDER — DICYCLOMINE HCL 20 MG PO TABS: 20.0000 mg | ORAL_TABLET | Freq: Two times a day (BID) | ORAL | 0 refills | Status: DC

## 2021-06-28 ENCOUNTER — Other Ambulatory Visit: Payer: Self-pay | Admitting: Family Medicine

## 2021-06-28 DIAGNOSIS — F5101 Primary insomnia: Secondary | ICD-10-CM

## 2021-07-11 ENCOUNTER — Other Ambulatory Visit: Payer: Self-pay | Admitting: Family Medicine

## 2021-07-11 ENCOUNTER — Encounter: Payer: Self-pay | Admitting: Family Medicine

## 2021-07-11 DIAGNOSIS — R197 Diarrhea, unspecified: Secondary | ICD-10-CM

## 2021-07-12 ENCOUNTER — Other Ambulatory Visit: Payer: Self-pay

## 2021-07-12 DIAGNOSIS — R197 Diarrhea, unspecified: Secondary | ICD-10-CM

## 2021-07-12 MED ORDER — DICYCLOMINE HCL 20 MG PO TABS: 20.0000 mg | ORAL_TABLET | Freq: Two times a day (BID) | ORAL | 0 refills | Status: DC

## 2021-07-29 ENCOUNTER — Other Ambulatory Visit: Payer: Self-pay

## 2021-07-29 ENCOUNTER — Encounter: Payer: Self-pay | Admitting: Emergency Medicine

## 2021-07-29 ENCOUNTER — Ambulatory Visit
Admission: EM | Admit: 2021-07-29 | Discharge: 2021-07-29 | Disposition: A | Discharge status: Home / Self Care | Payer: Medicare Other | Attending: Emergency Medicine | Admitting: Emergency Medicine

## 2021-07-29 DIAGNOSIS — N390 Urinary tract infection, site not specified: Secondary | ICD-10-CM | POA: Diagnosis present

## 2021-07-29 LAB — URINALYSIS, ROUTINE W REFLEX MICROSCOPIC
Glucose, UA: NEGATIVE mg/dL
Nitrite: NEGATIVE
Protein, ur: 30 mg/dL — AB
Specific Gravity, Urine: 1.025 (ref 1.005–1.030)
pH: 5.5 (ref 5.0–8.0)

## 2021-07-29 LAB — URINALYSIS, MICROSCOPIC (REFLEX)
RBC / HPF: >50 RBC/hpf (ref 0–5)
WBC, UA: >50 WBC/hpf (ref 0–5)

## 2021-07-29 MED ORDER — CEPHALEXIN 500 MG PO CAPS: 500.0000 mg | ORAL_CAPSULE | Freq: Four times a day (QID) | ORAL | 0 refills | Status: AC

## 2021-07-29 MED ORDER — ONDANSETRON 4 MG PO TBDP: 4.0000 mg | ORAL_TABLET | Freq: Three times a day (TID) | ORAL | 0 refills | Status: DC | PRN

## 2021-07-29 MED ORDER — PHENAZOPYRIDINE HCL 200 MG PO TABS: 200.0000 mg | ORAL_TABLET | Freq: Three times a day (TID) | ORAL | 0 refills | Status: DC

## 2021-07-29 MED ORDER — LOPERAMIDE HCL 2 MG PO TABS: 2.0000 mg | ORAL_TABLET | Freq: Four times a day (QID) | ORAL | 0 refills | Status: DC | PRN

## 2021-07-29 NOTE — ED Provider Notes (Signed)
? ?[facility] ?Provider Note ? ?Patient Contact: 12:42 PM (approximate) ? ? ?History  ? ?Dysuria and Urinary Frequency ? ? ?HPI ? ?Shawna Peterson is a 76 y.o. female who presents emergency department concern for possible UTI.  Patient has suprapubic cramping, dysuria and polyuria.  No flank pain.  No hematuria.  Patient states couple days ago she had some food poisoning, this completely resolved but now she feels like she is starting with a UTI.  No fevers or chills. ?  ? ? ?Physical Exam  ? ?Triage Vital Signs: ?ED Triage Vitals  ?Enc Vitals Group  ?   BP 07/29/21 1217 (!) 140/105  ?   Pulse Rate 07/29/21 1217 (!) 102  ?   Resp 07/29/21 1217 14  ?   Temp 07/29/21 1217 98.3 ?F (36.8 ?C)  ?   Temp Source 07/29/21 1217 Oral  ?   SpO2 07/29/21 1217 100 %  ?   Weight 07/29/21 1215 110 lb (49.9 kg)  ?   Height 07/29/21 1215 5' (1.524 m)  ?   Head Circumference --   ?   Peak Flow --   ?   Pain Score 07/29/21 1214 2  ?   Pain Loc --   ?   Pain Edu? --   ?   Excl. in [place]? --   ? ? ?Most recent vital signs: ?Vitals:  ? 07/29/21 1217 07/29/21 1219  ?BP: (!) 140/105 129/86  ?Pulse: (!) 102   ?Resp: 14   ?Temp: 98.3 ?F (36.8 ?C)   ?SpO2: 100%   ? ? ? ?General: Alert and in no acute distress.  ?Cardiovascular:  Good peripheral perfusion ?Respiratory: Normal respiratory effort without tachypnea or retractions. Lungs CTAB. Good air entry to the bases with no decreased or absent breath sounds. ?Gastrointestinal: Bowel sounds ?4 quadrants. Soft and nontender to palpation. No guarding or rigidity. No palpable masses. No distention. No CVA tenderness. ?Musculoskeletal: Full range of motion to all extremities.  ?Neurologic:  No gross focal neurologic deficits are appreciated.  ?Skin:   No rash noted ?Other: ? ? ?ED Results / Procedures / Treatments  ? ?Labs ?(all labs ordered are listed, but only abnormal results are displayed) ?Labs Reviewed  ?URINALYSIS, ROUTINE W REFLEX MICROSCOPIC - Abnormal; Notable for the  following components:  ?    Result Value  ? APPearance HAZY (*)   ? Hgb urine dipstick LARGE (*)   ? Bilirubin Urine SMALL (*)   ? Ketones, ur TRACE (*)   ? Protein, ur 30 (*)   ? Leukocytes,Ua SMALL (*)   ? All other components within normal limits  ?URINALYSIS, MICROSCOPIC (REFLEX) - Abnormal; Notable for the following components:  ? Bacteria, UA MANY (*)   ? All other components within normal limits  ?URINE CULTURE  ? ? ? ?EKG ? ? ? ? ?RADIOLOGY ? ? ? ?No results found. ? ?PROCEDURES: ? ?Critical Care performed: No ? ?Procedures ? ? ?MEDICATIONS ORDERED IN ED: ?Medications - No data to display ? ? ?IMPRESSION / MDM / ASSESSMENT AND PLAN / ED COURSE  ?I reviewed the triage vital signs and the nursing notes. ?             ?               ? ?Differential diagnosis includes, but is not limited to, UTI, nephrolithiasis, gastroenteritis ? ? ?Patient's diagnosis is consistent with UTI.  Patient presents emergency department dysuria, polyuria.  Symptoms began  this morning.  Patient has a large amount of bacteria that she has no nitrites or leukocytes at this time.  Given the symptoms I will treat for UTI and we will culture the urine at this time.  Follow-up with primary care as needed..  Patient is given ED precautions to return to the ED for any worsening or new symptoms. ? ? ? ?  ? ? ?FINAL CLINICAL IMPRESSION(S) / ED DIAGNOSES  ? ?Final diagnoses:  ?Lower urinary tract infectious disease  ? ? ? ?Rx / DC Orders  ? ?ED Discharge Orders   ? ?      Ordered  ?  cephALEXin (KEFLEX) 500 MG capsule  4 times daily       ? 07/29/21 1246  ?  phenazopyridine (PYRIDIUM) 200 MG tablet  3 times daily       ? 07/29/21 1246  ?  ondansetron (ZOFRAN-ODT) 4 MG disintegrating tablet  Every 8 hours PRN       ? 07/29/21 1246  ?  loperamide (IMODIUM A-D) 2 MG tablet  4 times daily PRN       ? 07/29/21 1246  ? ?  ?  ? ?  ? ? ? ?Note:  This document was prepared using Dragon voice recognition software and may include unintentional dictation  errors. ?  ?Darletta Moll, PA-C ?07/29/21 1247 ? ?

## 2021-07-29 NOTE — ED Triage Notes (Signed)
Patient c/o burning when urinating and urinary frequency that started this morning.   ?

## 2021-07-31 LAB — URINE CULTURE: Culture: >=100000 — AB

## 2021-08-01 ENCOUNTER — Other Ambulatory Visit: Payer: Self-pay

## 2021-08-01 ENCOUNTER — Ambulatory Visit (INDEPENDENT_AMBULATORY_CARE_PROVIDER_SITE_OTHER): Payer: Medicare Other | Admitting: Family Medicine

## 2021-08-01 ENCOUNTER — Encounter: Payer: Self-pay | Admitting: Family Medicine

## 2021-08-01 VITALS — BP 100/60 | HR 82 | Ht 60.0 in | Wt 113.0 lb

## 2021-08-01 DIAGNOSIS — R5383 Other fatigue: Secondary | ICD-10-CM

## 2021-08-01 DIAGNOSIS — K219 Gastro-esophageal reflux disease without esophagitis: Secondary | ICD-10-CM | POA: Diagnosis not present

## 2021-08-01 DIAGNOSIS — F5101 Primary insomnia: Secondary | ICD-10-CM

## 2021-08-01 DIAGNOSIS — K580 Irritable bowel syndrome with diarrhea: Secondary | ICD-10-CM

## 2021-08-01 DIAGNOSIS — E7800 Pure hypercholesterolemia, unspecified: Secondary | ICD-10-CM | POA: Diagnosis not present

## 2021-08-01 DIAGNOSIS — R197 Diarrhea, unspecified: Secondary | ICD-10-CM

## 2021-08-01 DIAGNOSIS — B349 Viral infection, unspecified: Secondary | ICD-10-CM

## 2021-08-01 LAB — HEMOCCULT GUIAC POC 1CARD (OFFICE): Fecal Occult Blood, POC: NEGATIVE

## 2021-08-01 MED ORDER — EZETIMIBE 10 MG PO TABS: 10.0000 mg | ORAL_TABLET | Freq: Every day | ORAL | 1 refills | Status: DC

## 2021-08-01 MED ORDER — OMEPRAZOLE 20 MG PO CPDR: 20.0000 mg | DELAYED_RELEASE_CAPSULE | Freq: Every day | ORAL | 1 refills | Status: DC

## 2021-08-01 MED ORDER — DICYCLOMINE HCL 20 MG PO TABS: 20.0000 mg | ORAL_TABLET | Freq: Two times a day (BID) | ORAL | 0 refills | Status: DC

## 2021-08-01 MED ORDER — ACYCLOVIR 400 MG PO TABS: 400.0000 mg | ORAL_TABLET | Freq: Two times a day (BID) | ORAL | 1 refills | Status: DC

## 2021-08-01 NOTE — Addendum Note (Signed)
Addended by: Fredderick Severance on: 08/01/2021 11:22 AM ? ? Modules accepted: Orders ? ?

## 2021-08-01 NOTE — Progress Notes (Addendum)
Date:  08/01/2021   Name:  Shawna Peterson   DOB:  03-Jun-1945   MRN:  [id number]   Chief Complaint: Insomnia, Hyperlipidemia, and Gastroesophageal Reflux  Insomnia Primary symptoms: no fragmented sleep, no sleep disturbance, no difficulty falling asleep, no somnolence, no frequent awakening, no premature morning awakening, no malaise/fatigue, no napping.   The current episode started more than one year. The onset quality is gradual. The problem occurs intermittently. The problem has been resolved since onset. Past treatments include medication. The treatment provided significant relief.  Hyperlipidemia This is a chronic problem. The current episode started more than 1 year ago. The problem is controlled. Recent lipid tests were reviewed and are normal. She has no history of chronic renal disease, diabetes, hypothyroidism, liver disease, obesity or nephrotic syndrome. Pertinent negatives include no chest pain, myalgias or shortness of breath. Current antihyperlipidemic treatment includes ezetimibe. The current treatment provides moderate improvement of lipids. There are no compliance problems.  Risk factors for coronary artery disease include post-menopausal and hypertension.  Gastroesophageal Reflux She reports no abdominal pain, no belching, no chest pain, no choking, no coughing, no dysphagia, no early satiety, no globus sensation, no heartburn, no hoarse voice, no nausea, no sore throat, no stridor, no tooth decay, no water brash or no wheezing. This is a chronic problem. The current episode started more than 1 year ago. Pertinent negatives include no fatigue or weight loss.  Thyroid Problem Presents for follow-up visit. Symptoms include cold intolerance. Patient reports no anxiety, constipation, depressed mood, diaphoresis, diarrhea, dry skin, fatigue, hair loss, heat intolerance, hoarse voice, leg swelling, menstrual problem, nail problem, palpitations, tremors, visual change, weight gain or  weight loss. The symptoms have been stable. Her past medical history is significant for hyperlipidemia. There is no history of diabetes.   Lab Results  Component Value Date   NA 136 06/03/2021   K 3.7 06/03/2021   CO2 22 06/03/2021   GLUCOSE 107 (H) 06/03/2021   BUN 17 06/03/2021   CREATININE 0.72 06/03/2021   CALCIUM 9.2 06/03/2021   GFRNONAA >60 06/03/2021   No results found for: CHOL, HDL, LDLCALC, LDLDIRECT, TRIG, CHOLHDL No results found for: TSH No results found for: HGBA1C Lab Results  Component Value Date   WBC 4.9 06/03/2021   HGB 14.8 06/03/2021   HCT 41.5 06/03/2021   MCV 93.5 06/03/2021   PLT 241 06/03/2021   Lab Results  Component Value Date   ALT 16 06/03/2021   AST 19 06/03/2021   ALKPHOS 72 06/03/2021   BILITOT 0.8 06/03/2021   No results found for: 25OHVITD2, 25OHVITD3, VD25OH   Review of Systems  Constitutional: Negative.  Negative for chills, diaphoresis, fatigue, fever, malaise/fatigue, unexpected weight change, weight gain and weight loss.  HENT:  Negative for congestion, ear discharge, ear pain, hoarse voice, rhinorrhea, sinus pressure, sneezing and sore throat.   Respiratory:  Negative for cough, choking, shortness of breath, wheezing and stridor.   Cardiovascular:  Negative for chest pain and palpitations.  Gastrointestinal:  Negative for abdominal pain, blood in stool, constipation, diarrhea, dysphagia, heartburn and nausea.  Endocrine: Positive for cold intolerance. Negative for heat intolerance.  Genitourinary:  Negative for dysuria, flank pain, frequency, hematuria, menstrual problem, urgency and vaginal discharge.  Musculoskeletal:  Negative for arthralgias, back pain and myalgias.  Skin:  Negative for rash.  Neurological:  Negative for dizziness, tremors, weakness and headaches.  Hematological:  Negative for adenopathy. Does not bruise/bleed easily.  Psychiatric/Behavioral:  Negative for dysphoric mood  and sleep disturbance. The patient has  insomnia. The patient is not nervous/anxious.    There are no problems to display for this patient.   Allergies  Allergen Reactions   Lorazepam Nausea Only and Other (See Comments)    Other Reaction: Nightmares Other Reaction: Nightmares Other Reaction: Nightmares Other Reaction: Nightmares Other Reaction: Nightmares Other Reaction: Nightmares     No past surgical history on file.  Social History   Tobacco Use   Smoking status: Never   Smokeless tobacco: Never  Vaping Use   Vaping Use: Never used  Substance Use Topics   Alcohol use: Yes    Comment: occasionally   Drug use: Never     Medication list has been reviewed and updated.  Current Meds  Medication Sig   cephALEXin (KEFLEX) 500 MG capsule Take 1 capsule (500 mg total) by mouth 4 (four) times daily for 7 days.   Cholecalciferol (VITAMIN D3) 125 MCG (5000 UT) CAPS Take 1 capsule by mouth daily.   dicyclomine (BENTYL) 20 MG tablet Take 1 tablet (20 mg total) by mouth 2 (two) times daily.   ezetimibe (ZETIA) 10 MG tablet TAKE 1 TABLET BY MOUTH EVERY DAY   fexofenadine (ALLEGRA) 180 MG tablet Allegra Allergy   fluticasone (FLONASE) 50 MCG/ACT nasal spray Flonase   ipratropium (ATROVENT) 0.06 % nasal spray Place 2 sprays into both nostrils 4 (four) times daily.   lansoprazole (PREVACID) 30 MG capsule Take 30 mg by mouth daily at 12 noon.   loperamide (IMODIUM A-D) 2 MG tablet Take 1 tablet (2 mg total) by mouth 4 (four) times daily as needed for diarrhea or loose stools.   Loratadine 10 MG CAPS Claritin   Multiple Vitamin (MULTIVITAMIN) tablet Take 1 tablet by mouth daily.   phenazopyridine (PYRIDIUM) 200 MG tablet Take 1 tablet (200 mg total) by mouth 3 (three) times daily.   traZODone (DESYREL) 50 MG tablet TAKE 0.5-1 TABLETS BY MOUTH AT BEDTIME AS NEEDED FOR SLEEP.   valACYclovir (VALTREX) 500 MG tablet Take 500 mg by mouth 2 (two) times daily.       08/01/2021    9:19 AM 06/06/2021    3:29 PM  PHQ 2/9 Scores   PHQ - 2 Score 0 1  PHQ- 9 Score 0 3       08/01/2021    9:19 AM 06/06/2021    3:29 PM  GAD 7 : Generalized Anxiety Score  Nervous, Anxious, on Edge 0 1  Control/stop worrying 0 1  Worry too much - different things 0 1  Trouble relaxing 0 0  Restless 0 0  Easily annoyed or irritable 0 0  Afraid - awful might happen 0 0  Total GAD 7 Score 0 3  Anxiety Difficulty Not difficult at all Not difficult at all    BP Readings from Last 3 Encounters:  08/01/21 100/60  07/29/21 129/86  06/06/21 120/60    Physical Exam Vitals and nursing note reviewed. Exam conducted with a chaperone present.  Constitutional:      General: She is not in acute distress.    Appearance: She is not diaphoretic.  HENT:     Head: Normocephalic and atraumatic.     Right Ear: Tympanic membrane and external ear normal.     Left Ear: Tympanic membrane and external ear normal.     Nose: Nose normal. No congestion or rhinorrhea.  Eyes:     General:        Right eye: No discharge.  Left eye: No discharge.     Conjunctiva/sclera: Conjunctivae normal.     Pupils: Pupils are equal, round, and reactive to light.  Neck:     Thyroid: No thyromegaly.     Vascular: No JVD.  Cardiovascular:     Rate and Rhythm: Normal rate and regular rhythm.     Heart sounds: Normal heart sounds, S1 normal and S2 normal. No murmur heard. No systolic murmur is present.  No diastolic murmur is present.    No friction rub. No gallop. No S3 or S4 sounds.  Pulmonary:     Effort: Pulmonary effort is normal.     Breath sounds: Normal breath sounds. No wheezing or rhonchi.  Abdominal:     General: Bowel sounds are normal.     Palpations: Abdomen is soft. There is no mass.     Tenderness: There is no abdominal tenderness. There is no guarding.  Genitourinary:    Rectum: Normal. Guaiac result negative. No mass.  Musculoskeletal:        General: Normal range of motion.     Cervical back: Normal range of motion and neck supple.   Lymphadenopathy:     Cervical: No cervical adenopathy.  Skin:    General: Skin is warm and dry.  Neurological:     Mental Status: She is alert.     Deep Tendon Reflexes: Reflexes are normal and symmetric.    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  Controlled.  Stable.  Patient is currently doing well with only an occasional Bentyl necessary.  She would like to be evaluated an appointment has been made with GI for further evaluation.  In the meantime she will continue with a PPI for gastric acid suppression Prilosec 20 mg once a day. - Ambulatory referral to Gastroenterology - omeprazole (PRILOSEC) 20 MG capsule; Take 1 capsule (20 mg total) by mouth daily.  Dispense: 90 capsule; Refill: 1 - POCT Occult Blood Stool  2. Gastroesophageal reflux disease, unspecified whether esophagitis present Chronic.  Controlled.  Stable.  Continue with Prilosec 20 mg once a day. - Ambulatory referral to Gastroenterology  3. Pure hypercholesterolemia Chronic.  Controlled.  Stable.  Will check lipid panel and likely will continue with Zetia 10 mg once a day. - Lipid Panel With LDL/HDL Ratio - ezetimibe (ZETIA) 10 MG tablet; Take 1 tablet (10 mg total) by mouth daily.  Dispense: 90 tablet; Refill: 1  4. Primary insomnia Chronic.  Controlled.  Stable.  Patient has not needed any more trazodone and does not take on a regular basis at this time.  Will not need to continue to refill.  5. Viral syndrome Patient has history of suppression of viral concerns and we will continue with acyclovir 400 mg twice a day.  This has been switched over from valacyclovir given the significant cost difference. - acyclovir (ZOVIRAX) 400 MG tablet; Take 1 tablet (400 mg total) by  mouth 2 (two) times daily.  Dispense: 180 tablet; Refill: 1  6. Fatigue, unspecified type Patient having some fatigue and cold intolerance and we will check TSH for thyroid concern - TSH - Renal Function Panel

## 2021-08-02 LAB — TSH: TSH: 2.24 u[IU]/mL (ref 0.450–4.500)

## 2021-08-02 LAB — RENAL FUNCTION PANEL
Albumin: 4.6 g/dL (ref 3.7–4.7)
BUN/Creatinine Ratio: 18 (ref 12–28)
BUN: 14 mg/dL (ref 8–27)
CO2: 23 mmol/L (ref 20–29)
Calcium: 9.7 mg/dL (ref 8.7–10.3)
Chloride: 102 mmol/L (ref 96–106)
Creatinine, Ser: 0.79 mg/dL (ref 0.57–1.00)
Glucose: 86 mg/dL (ref 70–99)
Phosphorus: 3.5 mg/dL (ref 3.0–4.3)
Potassium: 4.5 mmol/L (ref 3.5–5.2)
Sodium: 144 mmol/L (ref 134–144)
eGFR: 77 mL/min/{1.73_m2} (ref >59)

## 2021-08-02 LAB — LIPID PANEL WITH LDL/HDL RATIO
Cholesterol, Total: 245 mg/dL — ABNORMAL HIGH (ref 100–199)
HDL: 91 mg/dL (ref >39)
LDL Chol Calc (NIH): 134 mg/dL — ABNORMAL HIGH (ref 0–99)
LDL/HDL Ratio: 1.5 ratio (ref 0.0–3.2)
Triglycerides: 116 mg/dL (ref 0–149)
VLDL Cholesterol Cal: 20 mg/dL (ref 5–40)

## 2021-08-07 ENCOUNTER — Telehealth: Payer: Self-pay

## 2021-08-07 NOTE — Telephone Encounter (Signed)
Patient left vm calling you back. Requesting call back.  ?

## 2021-08-07 NOTE — Telephone Encounter (Signed)
CALLED PATIENT NO ANSWER LEFT VOICEMAIL FOR A CALL BACK ? ?

## 2021-09-03 ENCOUNTER — Encounter: Payer: Self-pay | Admitting: Family Medicine

## 2021-09-04 ENCOUNTER — Ambulatory Visit: Payer: Medicare Other | Admitting: Family Medicine

## 2021-09-18 ENCOUNTER — Encounter: Payer: Self-pay | Admitting: Family Medicine

## 2021-09-18 ENCOUNTER — Ambulatory Visit (INDEPENDENT_AMBULATORY_CARE_PROVIDER_SITE_OTHER): Payer: Medicare Other | Admitting: Family Medicine

## 2021-09-18 ENCOUNTER — Other Ambulatory Visit: Payer: Self-pay | Admitting: Family Medicine

## 2021-09-18 VITALS — BP 120/70 | HR 88 | Ht 60.0 in | Wt 113.0 lb

## 2021-09-18 DIAGNOSIS — N3090 Cystitis, unspecified without hematuria: Secondary | ICD-10-CM | POA: Diagnosis not present

## 2021-09-18 LAB — POCT URINALYSIS DIPSTICK: pH, UA: 6 (ref 5.0–8.0)

## 2021-09-18 MED ORDER — NITROFURANTOIN MONOHYD MACRO 100 MG PO CAPS: 100.0000 mg | ORAL_CAPSULE | Freq: Two times a day (BID) | ORAL | 0 refills | Status: AC

## 2021-09-18 NOTE — Progress Notes (Signed)
? ? ?Date:  09/18/2021  ? ?Name:  Shawna Peterson   DOB:  09-15-1945   MRN:  [id number] ? ? ?Chief Complaint: Urinary Tract Infection (Burning and urgency woke her up this am) ? ?Urinary Tract Infection  ?This is a new problem. The current episode started today. The problem has been gradually worsening. The quality of the pain is described as burning and aching (suprapubic). The pain is mild. Associated symptoms include chills, frequency and urgency. Pertinent negatives include no flank pain. Treatments tried: azo.  ? ?Lab Results  ?Component Value Date  ? NA 144 08/01/2021  ? K 4.5 08/01/2021  ? CO2 23 08/01/2021  ? GLUCOSE 86 08/01/2021  ? BUN 14 08/01/2021  ? CREATININE 0.79 08/01/2021  ? CALCIUM 9.7 08/01/2021  ? EGFR 77 08/01/2021  ? GFRNONAA >60 06/03/2021  ? ?Lab Results  ?Component Value Date  ? CHOL 245 (H) 08/01/2021  ? HDL 91 08/01/2021  ? LDLCALC 134 (H) 08/01/2021  ? TRIG 116 08/01/2021  ? ?Lab Results  ?Component Value Date  ? TSH 2.240 08/01/2021  ? ?No results found for: HGBA1C ?Lab Results  ?Component Value Date  ? WBC 4.9 06/03/2021  ? HGB 14.8 06/03/2021  ? HCT 41.5 06/03/2021  ? MCV 93.5 06/03/2021  ? PLT 241 06/03/2021  ? ?Lab Results  ?Component Value Date  ? ALT 16 06/03/2021  ? AST 19 06/03/2021  ? ALKPHOS 72 06/03/2021  ? BILITOT 0.8 06/03/2021  ? ?No results found for: 25OHVITD2, Benson, VD25OH  ? ?Review of Systems  ?Constitutional:  Positive for chills.  ?Genitourinary:  Positive for frequency and urgency. Negative for flank pain.  ? ?There are no problems to display for this patient. ? ? ?Allergies  ?Allergen Reactions  ? Lorazepam Nausea Only and Other (See Comments)  ?  Other Reaction: Nightmares ?Other Reaction: Nightmares ?Other Reaction: Nightmares ?Other Reaction: Nightmares ?Other Reaction: Nightmares ?Other Reaction: Nightmares ?  ? Zetia [Ezetimibe] Other (See Comments)  ?  "Muscle weakness and did not feel good"  ? ? ?No past surgical history on file. ? ?Social History   ? ?Tobacco Use  ? Smoking status: Never  ? Smokeless tobacco: Never  ?Vaping Use  ? Vaping Use: Never used  ?Substance Use Topics  ? Alcohol use: Yes  ?  Comment: occasionally  ? Drug use: Never  ? ? ? ?Medication list has been reviewed and updated. ? ?Current Meds  ?Medication Sig  ? acyclovir (ZOVIRAX) 400 MG tablet Take 1 tablet (400 mg total) by mouth 2 (two) times daily.  ? Cholecalciferol (VITAMIN D3) 125 MCG (5000 UT) CAPS Take 1 capsule by mouth daily.  ? fexofenadine (ALLEGRA) 180 MG tablet Allegra Allergy  ? fluticasone (FLONASE) 50 MCG/ACT nasal spray Flonase  ? ipratropium (ATROVENT) 0.06 % nasal spray Place 2 sprays into both nostrils 4 (four) times daily.  ? Loratadine 10 MG CAPS Claritin  ? Multiple Vitamin (MULTIVITAMIN) tablet Take 1 tablet by mouth daily.  ? omeprazole (PRILOSEC) 20 MG capsule Take 1 capsule (20 mg total) by mouth daily.  ? phenazopyridine (PYRIDIUM) 200 MG tablet Take 1 tablet (200 mg total) by mouth 3 (three) times daily.  ? ? ? ?  08/01/2021  ?  9:19 AM 06/06/2021  ?  3:29 PM  ?GAD 7 : Generalized Anxiety Score  ?Nervous, Anxious, on Edge 0 1  ?Control/stop worrying 0 1  ?Worry too much - different things 0 1  ?Trouble relaxing 0 0  ?Restless 0  0  ?Easily annoyed or irritable 0 0  ?Afraid - awful might happen 0 0  ?Total GAD 7 Score 0 3  ?Anxiety Difficulty Not difficult at all Not difficult at all  ? ? ? ?  08/01/2021  ?  9:19 AM  ?Depression screen PHQ 2/9  ?Decreased Interest 0  ?Down, Depressed, Hopeless 0  ?PHQ - 2 Score 0  ?Altered sleeping 0  ?Tired, decreased energy 0  ?Change in appetite 0  ?Feeling bad or failure about yourself  0  ?Trouble concentrating 0  ?Moving slowly or fidgety/restless 0  ?Suicidal thoughts 0  ?PHQ-9 Score 0  ?Difficult doing work/chores Not difficult at all  ? ? ?BP Readings from Last 3 Encounters:  ?09/18/21 120/70  ?08/01/21 100/60  ?07/29/21 129/86  ? ? ?Physical Exam ? ?Wt Readings from Last 3 Encounters:  ?09/18/21 113 lb (51.3 kg)  ?08/01/21  113 lb (51.3 kg)  ?07/29/21 110 lb (49.9 kg)  ? ? ?BP 120/70   Pulse 88   Ht 5' (1.524 m)   Wt 113 lb (51.3 kg)   BMI 22.07 kg/m?  ? ?Assessment and Plan: ? ?1. Cystitis ?New onset.  Concern of a recurrence because she had a UTI for similar nature in March of this year.  Urinalysis was done but it was complicated by Azo but there was some evidence of red cells but could not tell if leukocytes were present.  Symptomatology of frequency urgency and suprapubic discomfort would suggest a UTI and we will treat with Macrobid twice a day for 5 days.  We have sent urine culture because last culture grew out E. coli that was sensitive to everything.  We will check patient on a as needed basis ?- POCT urinalysis dipstick ?- Urine Culture ?- nitrofurantoin, macrocrystal-monohydrate, (MACROBID) 100 MG capsule; Take 1 capsule (100 mg total) by mouth 2 (two) times daily for 7 days.  Dispense: 14 capsule; Refill: 0  ? ? ?

## 2021-09-20 LAB — URINE CULTURE: Organism ID, Bacteria: NO GROWTH

## 2021-09-20 LAB — SPECIMEN STATUS REPORT

## 2021-09-21 ENCOUNTER — Encounter: Payer: Self-pay | Admitting: Family Medicine

## 2021-10-27 ENCOUNTER — Other Ambulatory Visit: Payer: Self-pay

## 2021-10-31 ENCOUNTER — Ambulatory Visit (INDEPENDENT_AMBULATORY_CARE_PROVIDER_SITE_OTHER): Payer: Medicare Other | Admitting: Gastroenterology

## 2021-10-31 ENCOUNTER — Encounter: Payer: Self-pay | Admitting: Gastroenterology

## 2021-10-31 VITALS — BP 108/72 | HR 83 | Temp 97.8°F | Ht 60.0 in | Wt 110.2 lb

## 2021-10-31 DIAGNOSIS — K219 Gastro-esophageal reflux disease without esophagitis: Secondary | ICD-10-CM | POA: Diagnosis not present

## 2021-10-31 MED ORDER — DICYCLOMINE HCL 20 MG PO TABS: 20.0000 mg | ORAL_TABLET | Freq: Three times a day (TID) | ORAL | 0 refills | Status: DC

## 2021-10-31 NOTE — Progress Notes (Signed)
Arlyss Repress, MD [address]  [address]  [place], [place] [address]  Main: [phone]  Fax: [phone]    Gastroenterology Consultation  Referring Provider:     Audery Amel, MD Primary Care Physician:  Duanne Limerick, MD Primary Gastroenterologist:  Dr. Arlyss Repress Reason for Consultation: Chronic GERD, abdominal pain        HPI:   Shawna Peterson is a 76 y.o. female referred by Dr. Duanne Limerick, MD  for consultation & management of chronic GERD and abdominal pain.  Patient went to the ER in January 2022 secondary to upper abdominal pain.  She underwent CT abdomen pelvis which was unremarkable for acute intra-abdominal pathology being seen on the diabetes would normalize.  She received Bentyl 20 mg that she took it for 3 days only which provided relief.  Pain did not recur since then.  Patient does have a history of reflux for which she takes omeprazole 20 mg daily for a long time.  She reports undergoing upper endoscopy more than 10 years ago.  She also underwent colonoscopy more than 10 years ago she did not like the experience offered.  Patient is very active, independent, lives alone, exercises daily and eats healthy.  Patient denies any GI symptoms today  NSAIDs: None  Antiplts/Anticoagulants/Anti thrombotics: None  GI Procedures: Reports undergoing EGD and colonoscopy more than 10 years ago, reportedly normal  History reviewed. No pertinent past medical history.  History reviewed. No pertinent surgical history.   Current Outpatient Medications:    acyclovir (ZOVIRAX) 400 MG tablet, Take 1 tablet (400 mg total) by mouth 2 (two) times daily., Disp: 180 tablet, Rfl: 1   albuterol (VENTOLIN HFA) 108 (90 Base) MCG/ACT inhaler, Inhale 2 puffs into the lungs every 4 (four) hours as needed., Disp: , Rfl:    ALPRAZolam (XANAX) 0.25 MG tablet, Take 1 tablet by mouth daily., Disp: , Rfl:    Biotin (SUPER BIOTIN) 5 MG TABS, Take by mouth., Disp: , Rfl:     chlorpheniramine (CHLOR-TRIMETON) 4 MG tablet, Take 1 tablet by mouth daily., Disp: , Rfl:    Cholecalciferol 50 MCG (2000 UT) CAPS, Take by mouth., Disp: , Rfl:    dicyclomine (BENTYL) 20 MG tablet, Take 1 tablet (20 mg total) by mouth every 8 (eight) hours., Disp: 20 tablet, Rfl: 0   diphenhydrAMINE (SOMINEX) 25 MG tablet, Take by mouth., Disp: , Rfl:    fexofenadine (ALLEGRA) 180 MG tablet, Take by mouth., Disp: , Rfl:    fluticasone (FLONASE) 50 MCG/ACT nasal spray, SPRAY 2 SPRAYS INTO EACH NOSTRIL EVERY DAY, Disp: , Rfl:    ipratropium (ATROVENT) 0.06 % nasal spray, Place 2 sprays into both nostrils 4 (four) times daily., Disp: 15 mL, Rfl: 12   loratadine (CLARITIN) 10 MG tablet, Take by mouth., Disp: , Rfl:    Misc Natural Products (ESTROVEN + ENERGY MAX STRENGTH) TABS, Herbal Name: Extended Potassium + Magnesium oxide/citrate 1 capsule by mouth once daily, Disp: , Rfl:    montelukast (SINGULAIR) 10 MG tablet, montelukast 10 mg tablet, Disp: , Rfl:    Multiple Vitamin (MULTIVITAMIN) tablet, Take 1 tablet by mouth daily., Disp: , Rfl:    Olopatadine HCl 0.2 % SOLN, Apply to eye., Disp: , Rfl:    omeprazole (PRILOSEC) 20 MG capsule, Take 1 capsule (20 mg total) by mouth daily., Disp: 90 capsule, Rfl: 1   Probiotic Product (DIGESTIVE ADVANTAGE) CAPS, Take 1 capsule by mouth daily., Disp: , Rfl:  traMADol (ULTRAM) 50 MG tablet, Take 1 tablet by mouth every 6 (six) hours as needed., Disp: , Rfl:    triamterene-hydrochlorothiazide (MAXZIDE-25) 37.5-25 MG tablet, TAKE 1/2-1 TABLET BY MOUTH ONCE DAILY, Disp: , Rfl:    valACYclovir (VALTREX) 1000 MG tablet, Take by mouth., Disp: , Rfl:    Family History  Problem Relation Age of Onset   Breast cancer Maternal Aunt      Social History   Tobacco Use   Smoking status: Never   Smokeless tobacco: Never  Vaping Use   Vaping Use: Never used  Substance Use Topics   Alcohol use: Yes    Comment: occasionally   Drug use: Never    Allergies as  of 10/31/2021 - Review Complete 10/31/2021  Allergen Reaction Noted   Lorazepam Nausea Only and Other (See Comments) 03/17/2019   Zetia [ezetimibe] Other (See Comments) 09/18/2021    Review of Systems:    All systems reviewed and negative except where noted in HPI.   Physical Exam:  BP 108/72 (BP Location: Left Arm, Patient Position: Sitting, Cuff Size: Normal)   Pulse 83   Temp 97.8 F (36.6 C) (Oral)   Ht 5' (1.524 m)   Wt 110 lb 4 oz (50 kg)   BMI 21.53 kg/m  No LMP recorded. Patient is postmenopausal.  General:   Alert,  Well-developed, well-nourished, pleasant and cooperative in NAD Head:  Normocephalic and atraumatic. Eyes:  Sclera clear, no icterus.   Conjunctiva pink. Ears:  Normal auditory acuity. Nose:  No deformity, discharge, or lesions. Mouth:  No deformity or lesions,oropharynx pink & moist. Neck:  Supple; no masses or thyromegaly. Lungs:  Respirations even and unlabored.  Clear throughout to auscultation.   No wheezes, crackles, or rhonchi. No acute distress. Heart:  Regular rate and rhythm; no murmurs, clicks, rubs, or gallops. Abdomen:  Normal bowel sounds. Soft, non-tender and non-distended without masses, hepatosplenomegaly or hernias noted.  No guarding or rebound tenderness.   Rectal: Not performed Msk:  Symmetrical without gross deformities. Good, equal movement & strength bilaterally. Pulses:  Normal pulses noted. Extremities:  No clubbing or edema.  No cyanosis. Neurologic:  Alert and oriented x3;  grossly normal neurologically. Skin:  Intact without significant lesions or rashes. No jaundice. Lymph Nodes:  No significant cervical adenopathy. Psych:  Alert and cooperative. Normal mood and affect.  Imaging Studies: Reviewed  Assessment and Plan:   Shawna Peterson is a 76 y.o. pleasant Caucasian female with no significant past medical history, chronic GERD on low-dose omeprazole.  Self-limited episode of upper abdominal pain which has resolved after  taking Bentyl.  Patient is requesting a short course of Bentyl for future episodes   Advised patient to try to switch from omeprazole to Pepcid and she agreed  I have also discussed with patient regarding screening colonoscopy given that she is overall healthy and very independent.  Patient said she will think about the GI procedures and will call our office back if she decides to proceed I have also advised her to undergo upper endoscopy given her history of chronic GERD along with colonoscopy  Follow up as needed   Arlyss Repress, MD

## 2021-12-06 ENCOUNTER — Telehealth: Payer: Self-pay | Admitting: Family Medicine

## 2021-12-06 NOTE — Telephone Encounter (Signed)
Copied from [place] [id number]. Topic: Medicare AWV >> Dec 06, 2021  1:16 PM Jae Dire wrote: Reason for CRM:  Left message for patient to call back and schedule Medicare Annual Wellness Visit (AWV) in office.   If unable to come into the office for AWV,  please offer to do virtually or by telephone.  No hx of AWV eligible for AWVI per [facility] as of 10/05/2021  Please schedule at anytime with [facility] Health Advisor.      45 minute appointment   Any questions, please call me at [phone]

## 2021-12-20 ENCOUNTER — Ambulatory Visit (INDEPENDENT_AMBULATORY_CARE_PROVIDER_SITE_OTHER): Payer: Medicare Other

## 2021-12-20 DIAGNOSIS — Z0000 Encounter for general adult medical examination without abnormal findings: Secondary | ICD-10-CM | POA: Diagnosis not present

## 2021-12-20 NOTE — Patient Instructions (Signed)

## 2021-12-20 NOTE — Progress Notes (Signed)
I connected with  Josefine Class on 12/20/21 by a audio enabled telemedicine application and verified that I am speaking with the correct person using two identifiers.  Patient Location: Home  Provider Location: Office/Clinic  I discussed the limitations of evaluation and management by telemedicine. The patient expressed understanding and agreed to proceed.   Subjective:   Shawna Peterson is a 77 y.o. female who presents for Medicare Annual (Subsequent) preventive examination.  Review of Systems    Per HPI unless specifically indicated below Cardiac Risk Factors include: advanced age (>48mn, >>26women);female gender          Objective:       10/31/2021    1:57 PM 09/18/2021   11:39 AM 08/01/2021    9:10 AM  Vitals with BMI  Height '5\' 0"'  '5\' 0"'  '5\' 0"'   Weight 110 lbs 4 oz 113 lbs 113 lbs  BMI 21.53 [phone] Systolic [phone] Diastolic 72 70 60  Pulse 83 88 82    There were no vitals filed for this visit. There is no height or weight on file to calculate BMI.     07/29/2021   12:17 PM  Advanced Directives  Does Patient Have a Medical Advance Directive? Yes  Type of [place]    Current Medications (verified) Outpatient Encounter Medications as of 12/20/2021  Medication Sig   acyclovir (ZOVIRAX) 400 MG tablet Take 1 tablet (400 mg total) by mouth 2 (two) times daily. (Patient taking differently: Take 400 mg by mouth daily.)   albuterol (VENTOLIN HFA) 108 (90 Base) MCG/ACT inhaler Inhale 2 puffs into the lungs every 4 (four) hours as needed.   ALPRAZolam (XANAX) 0.25 MG tablet Take 1 tablet by mouth daily as needed.   chlorpheniramine (CHLOR-TRIMETON) 4 MG tablet Take 1 tablet by mouth daily as needed.   Cholecalciferol 50 MCG (2000 UT) CAPS Take by mouth.   dicyclomine (BENTYL) 20 MG tablet Take 1 tablet (20 mg total) by mouth every 8 (eight) hours. (Patient taking differently: Take 20 mg by mouth as needed.)    diphenhydrAMINE (SOMINEX) 25 MG tablet Take by mouth.   fexofenadine (ALLEGRA) 180 MG tablet Take by mouth.   fluticasone (FLONASE) 50 MCG/ACT nasal spray SPRAY 2 SPRAYS INTO EACH NOSTRIL EVERY DAY   ipratropium (ATROVENT) 0.06 % nasal spray Place 2 sprays into both nostrils 4 (four) times daily.   loratadine (CLARITIN) 10 MG tablet Take by mouth.   Misc Natural Products (ESTROVEN + ENERGY MAX STRENGTH) TABS Herbal Name: Extended Potassium + Magnesium oxide/citrate 1 capsule by mouth once daily   Multiple Vitamin (MULTIVITAMIN) tablet Take 1 tablet by mouth daily.   Olopatadine HCl 0.2 % SOLN Apply to eye.   omeprazole (PRILOSEC) 20 MG capsule Take 1 capsule (20 mg total) by mouth daily.   Probiotic Product (DIGESTIVE ADVANTAGE) CAPS Take 1 capsule by mouth daily.   traMADol (ULTRAM) 50 MG tablet Take 1 tablet by mouth every 6 (six) hours as needed.   triamterene-hydrochlorothiazide (MAXZIDE-25) 37.5-25 MG tablet Take 1 tablet by mouth daily as needed.   [DISCONTINUED] Biotin (SUPER BIOTIN) 5 MG TABS Take by mouth. (Patient not taking: Reported on 12/20/2021)   [DISCONTINUED] montelukast (SINGULAIR) 10 MG tablet montelukast 10 mg tablet (Patient not taking: Reported on 12/20/2021)   No facility-administered encounter medications on file as of 12/20/2021.    Allergies (verified) Lorazepam and Zetia [ezetimibe]   History: No past medical history on file.  No past surgical history on file. Family History  Problem Relation Age of Onset   Breast cancer Maternal Aunt    Social History   Socioeconomic History   Marital status: Widowed    Spouse name: Not on file   Number of children: 1   Years of education: Not on file   Highest education level: Not on file  Occupational History   Occupation: Retired  Tobacco Use   Smoking status: Never   Smokeless tobacco: Never  Vaping Use   Vaping Use: Never used  Substance and Sexual Activity   Alcohol use: Yes    Comment: occasionally   Drug  use: Never   Sexual activity: Not Currently  Other Topics Concern   Not on file  Social History Narrative   Not on file   Social Determinants of Health   Financial Resource Strain: Low Risk  (12/20/2021)   Overall Financial Resource Strain (CARDIA)    Difficulty of Paying Living Expenses: Not hard at all  Food Insecurity: No Food Insecurity (12/20/2021)   Hunger Vital Sign    Worried About Running Out of Food in the Last Year: Never true    [place] in the Last Year: Never true  Transportation Needs: No Transportation Needs (12/20/2021)   PRAPARE - Hydrologist (Medical): No    Lack of Transportation (Non-Medical): No  Physical Activity: Sufficiently Active (12/20/2021)   Exercise Vital Sign    Days of Exercise per Week: 3 days    Minutes of Exercise per Session: 60 min  Stress: No Stress Concern Present (12/20/2021)   [place]    Feeling of Stress : Only a little  Social Connections: Moderately Isolated (12/20/2021)   Social Connection and Isolation Panel [NHANES]    Frequency of Communication with Friends and Family: Twice a week    Frequency of Social Gatherings with Friends and Family: Twice a week    Attends Religious Services: Never    Marine scientist or Organizations: Yes    Attends Music therapist: More than 4 times per year    Marital Status: Widowed    Tobacco Counseling No counseling needed. The pt does not smoke   Clinical Intake:  Pre-visit preparation completed: No  Pain : No/denies pain     Nutritional Status: BMI 25 -29 Overweight Nutritional Risks: None  How often do you need to have someone help you when you read instructions, pamphlets, or other written materials from your doctor or pharmacy?: (P) 1 - Never  Diabetic?no  Interpreter Needed?: No  Information entered by :: Donnie [place], [place] of Daily Living     12/20/2021    1:07 PM 12/16/2021    8:50 AM  In your present state of health, do you have any difficulty performing the following activities:  Hearing? 1 1  Vision? 1 0  Difficulty concentrating or making decisions? 0 0  Walking or climbing stairs? 0 0  Dressing or bathing? 0 0  Doing errands, shopping? 0 0  Preparing Food and eating ?  N  Using the Toilet?  N  In the past six months, have you accidently leaked urine?  N  Do you have problems with loss of bowel control?  N  Managing your Medications?  N  Managing your Finances?  N  Housekeeping or managing your Housekeeping?  N    Patient Care Team: Otilio Miu  C, MD as PCP - General (Family Medicine)  Indicate any recent Medical Services you may have received from other than [facility] providers in the past year (date may be approximate). The pt was seen at the [facility] on 07/29/2021 for a lower urinary tract infectious disease.     Assessment:   This is a routine wellness examination for Lajoyce.  Hearing/Vision screen Hearing issues. Meniere's disease of both ears, bilateral hearing aids. Annual Eye exam. [facility] wear glasses.  Dietary issues and exercise activities discussed: Current Exercise Habits: Structured exercise class, Type of exercise: walking, Time (Minutes): 35, Frequency (Times/Week): 3, Weekly Exercise (Minutes/Week): 105, Intensity: Mild   Goals Addressed             This Visit's Progress    Stay Active and Independent       Why is this important?   Regular activity or exercise is important to managing back pain.  Activity helps to keep your muscles strong.  You will sleep better and feel more relaxed.  You will have more energy and feel less stressed.  If you are not active now, start slowly. Little changes make a big difference.  Rest, but not too much.  Stay as active as you can and listen to your body's signals.     Notes:        Depression Screen     12/20/2021    1:01 PM 08/01/2021    9:19 AM 06/06/2021    3:29 PM  PHQ 2/9 Scores  PHQ - 2 Score 3 0 1  PHQ- 9 Score 6 0 3    Fall Risk    12/16/2021    8:50 AM 06/06/2021    3:28 PM  Fall Risk   Falls in the past year? 0 0  Number falls in past yr:  0  Injury with Fall?  0  Risk for fall due to :  No Fall Risks;Impaired balance/gait  Follow up  Falls evaluation completed    FALL RISK PREVENTION PERTAINING TO THE HOME:  Any stairs in or around the home? Yes  If so, are there any without handrails? No  Home free of loose throw rugs in walkways, pet beds, electrical cords, etc? Yes  Adequate lighting in your home to reduce risk of falls? Yes   ASSISTIVE DEVICES UTILIZED TO PREVENT FALLS:  Life alert? No  Use of a cane, walker or w/c? Yes .  Intermittent when her chronic back pain flare up. Grab bars in the bathroom? Yes  Shower chair or bench in shower? No  Elevated toilet seat or a handicapped toilet? No   TIMED UP AND GO:  Was the test performed?  unable to perform, virtual appt .  Cognitive Function:        12/20/2021    1:06 PM  6CIT Screen  What Year? 0 points  What month? 0 points  What time? 0 points  Count back from 20 0 points  Months in reverse 0 points  Repeat phrase 0 points  Total Score 0 points    Immunizations Immunization History  Administered Date(s) Administered   DTaP 02/05/2003   Dtap, Unspecified 02/05/2003   Fluad Quad(high Dose 65+) 02/06/2017, 01/26/2019   Hepatitis A 12/30/2012   Hepatitis A, Adult 12/30/2012, 07/10/2013   Hepatitis A, Ped/Adol-2 Dose 12/30/2012   Hepatitis B 02/05/2000, 05/02/2006   Hepatitis B, adult 02/05/2000, 05/02/2006   Hepatitis B, ped/adol 02/05/2000, 05/02/2006   Influenza Nasal  02/04/2017   Influenza, High Dose Seasonal PF 01/30/2014, 01/30/2014, 02/09/2015, 02/09/2015, 02/06/2017, 02/06/2017, 02/26/2018, 02/26/2018, 01/26/2019, 01/26/2019   Influenza,inj,Quad PF,6+ Mos 02/11/2013    Influenza-Unspecified 03/08/2012, 03/08/2012, 02/11/2013, 01/24/2016, 02/06/2017   MMR 01/06/1995   PFIZER(Purple Top)SARS-COV-2 Vaccination 06/01/2019, 06/22/2019   PPD Test 10/18/2017, 10/18/2017, 05/13/2020   Pneumococcal Conjugate-13 05/31/2015   Pneumococcal Polysaccharide-23 07/27/2010   Td 05/18/2014, 05/18/2014   Tdap 10/21/2007   Typhoid Inactivated 10/01/2013   Yellow Fever 10/01/2013    TDAP status: Up to date  Flu Vaccine status: Up to date  Pneumococcal vaccine status: Up to date  Covid-19 vaccine status: Completed vaccines  Qualifies for Shingles Vaccine? Yes   Zostavax completed No   Shingrix Completed?: No.    Education has been provided regarding the importance of this vaccine. Patient has been advised to call insurance company to determine out of pocket expense if they have not yet received this vaccine. Advised may also receive vaccine at local pharmacy or Health Dept. Verbalized acceptance and understanding.  Screening Tests Health Maintenance  Topic Date Due   COVID-19 Vaccine (3 - Pfizer risk series) 07/20/2019   INFLUENZA VACCINE  12/05/2021   Zoster Vaccines- Shingrix (1 of 2) 03/22/2022 (Originally 10/25/1964)   DEXA SCAN  08/02/2022 (Originally 10/24/2009)   Hepatitis C Screening  08/02/2022 (Originally 10/25/1963)   TETANUS/TDAP  05/18/2024   Pneumonia Vaccine 77+ Years old  Completed   HPV VACCINES  Aged Out    Health Maintenance  Health Maintenance Due  Topic Date Due   COVID-19 Vaccine (3 - Pfizer risk series) 07/20/2019   INFLUENZA VACCINE  12/05/2021    Colorectal cancer screening: No longer required.   Mammogram status: No longer required due to age.  Dexa scan: Postponed 08/02/2022  Lung Cancer Screening: (Low Dose CT Chest recommended if Age 67-80 years, 30 pack-year currently smoking OR have quit w/in 15years.) does not qualify.   Lung Cancer Screening Referral: doesn't qualify  Additional Screening:  Hepatitis C Screening:  does not qualify  Vision Screening: Recommended annual ophthalmology exams for early detection of glaucoma and other disorders of the eye. Is the patient up to date with their annual eye exam?  Yes  Who is the provider or what is the name of the office in which the patient attends annual eye exams? [facility] If pt is not established with a provider, would they like to be referred to a provider to establish care? No .   Dental Screening: Recommended annual dental exams for proper oral hygiene  Community Resource Referral / Chronic Care Management: CRR required this visit?  No   CCM required this visit?  No      Plan:     I have personally reviewed and noted the following in the patient's chart:   Medical and social history Use of alcohol, tobacco or illicit drugs  Current medications and supplements including opioid prescriptions.  Functional ability and status Nutritional status Physical activity Advanced directives List of other physicians Hospitalizations, surgeries, and ER visits in previous 12 months Vitals Screenings to include cognitive, depression, and falls Referrals and appointments  In addition, I have reviewed and discussed with patient certain preventive protocols, quality metrics, and best practice recommendations. A written personalized care plan for preventive services as well as general preventive health recommendations were provided to patient.    Ms. Ketter , Thank you for taking time to come for your Medicare Wellness Visit. I appreciate your ongoing commitment to your health goals. Please  review the following plan we discussed and let me know if I can assist you in the future.   These are the goals we discussed:  Goals      Stay Active and Independent     Why is this important?   Regular activity or exercise is important to managing back pain.  Activity helps to keep your muscles strong.  You will sleep better and feel more relaxed.  You  will have more energy and feel less stressed.  If you are not active now, start slowly. Little changes make a big difference.  Rest, but not too much.  Stay as active as you can and listen to your body's signals.             This is a list of the screening recommended for you and due dates:  Health Maintenance  Topic Date Due   COVID-19 Vaccine (3 - Pfizer risk series) 07/20/2019   Flu Shot  12/05/2021   Zoster (Shingles) Vaccine (1 of 2) 03/22/2022*   DEXA scan (bone density measurement)  08/02/2022*   Hepatitis C Screening: USPSTF Recommendation to screen - Ages 18-79 yo.  08/02/2022*   Tetanus Vaccine  05/18/2024   Pneumonia Vaccine  Completed   HPV Vaccine  Aged Out  *Topic was postponed. The date shown is not the original due date.     Wilson Singer, [place]   12/20/2021   Nurse Notes: Approximately 30 minute non-face-to-face visit

## 2022-01-19 ENCOUNTER — Telehealth: Payer: Self-pay | Admitting: Family Medicine

## 2022-01-19 NOTE — Telephone Encounter (Signed)
Copied from [place] [phone]. Topic: General - Other >> Jan 19, 2022  8:34 AM Shawna Peterson wrote: Reason for CRM: The patient would like to know if they'll have labs done at their upcoming appointment on 02/01/22  Please contact the patient further when possible

## 2022-01-19 NOTE — Telephone Encounter (Signed)
done

## 2022-01-29 ENCOUNTER — Other Ambulatory Visit: Payer: Self-pay | Admitting: Family Medicine

## 2022-01-29 DIAGNOSIS — K580 Irritable bowel syndrome with diarrhea: Secondary | ICD-10-CM

## 2022-01-29 NOTE — Telephone Encounter (Signed)
Requested Prescriptions  Pending Prescriptions Disp Refills  . omeprazole (PRILOSEC) 20 MG capsule [Pharmacy Med Name: OMEPRAZOL RX CAP '20MG'$ ] 90 capsule 2    Sig: TAKE 1 CAPSULE DAILY     Gastroenterology: Proton Pump Inhibitors Passed - 01/29/2022  8:19 AM      Passed - Valid encounter within last 12 months    Recent Outpatient Visits          4 months ago Cystitis   [facility] [place], McInt[place], MD   6 months ago Irritable bowel syndrome with diarrhea   [facility] [place], Deanna C, MD   7 months ago Establishing care with new doctor, encounter for   [place] [facility], MD      Future Appointments            In 1 week Juline Patch, MD [place] [facility]

## 2022-02-01 ENCOUNTER — Ambulatory Visit: Payer: Medicare Other | Admitting: Family Medicine

## 2022-02-06 ENCOUNTER — Ambulatory Visit: Payer: Medicare Other | Admitting: Family Medicine

## 2022-02-15 ENCOUNTER — Encounter: Payer: Self-pay | Admitting: Family Medicine

## 2022-02-15 ENCOUNTER — Ambulatory Visit (INDEPENDENT_AMBULATORY_CARE_PROVIDER_SITE_OTHER): Payer: Medicare Other | Admitting: Family Medicine

## 2022-02-15 VITALS — BP 122/64 | HR 80 | Ht 60.0 in | Wt 109.0 lb

## 2022-02-15 DIAGNOSIS — E7801 Familial hypercholesterolemia: Secondary | ICD-10-CM | POA: Diagnosis not present

## 2022-02-15 DIAGNOSIS — B349 Viral infection, unspecified: Secondary | ICD-10-CM

## 2022-02-15 DIAGNOSIS — T753XXD Motion sickness, subsequent encounter: Secondary | ICD-10-CM | POA: Diagnosis not present

## 2022-02-15 DIAGNOSIS — M1990 Unspecified osteoarthritis, unspecified site: Secondary | ICD-10-CM | POA: Diagnosis not present

## 2022-02-15 MED ORDER — SCOPOLAMINE 1 MG/3DAYS TD PT72: 1.0000 | MEDICATED_PATCH | TRANSDERMAL | 0 refills | Status: DC

## 2022-02-15 NOTE — Patient Instructions (Signed)

## 2022-02-15 NOTE — Progress Notes (Signed)
Date:  02/15/2022   Name:  Shawna Peterson   DOB:  06-Jul-1945   MRN:  [id number]   Chief Complaint: Hyperlipidemia and motion sickness  Patient has upcoming cruise in which she has had since  Hyperlipidemia This is a chronic problem. The current episode started more than 1 year ago. The problem is uncontrolled. Recent lipid tests were reviewed and are high. She has no history of chronic renal disease, diabetes, hypothyroidism, liver disease, obesity or nephrotic syndrome. There are no known factors aggravating her hyperlipidemia. Pertinent negatives include no chest pain, focal sensory loss, focal weakness, leg pain, myalgias or shortness of breath. Current antihyperlipidemic treatment includes diet change. The current treatment provides moderate improvement of lipids. There are no compliance problems.  Risk factors for coronary artery disease include diabetes mellitus.    Lab Results  Component Value Date   NA 144 08/01/2021   K 4.5 08/01/2021   CO2 23 08/01/2021   GLUCOSE 86 08/01/2021   BUN 14 08/01/2021   CREATININE 0.79 08/01/2021   CALCIUM 9.7 08/01/2021   EGFR 77 08/01/2021   GFRNONAA >60 06/03/2021   Lab Results  Component Value Date   CHOL 245 (H) 08/01/2021   HDL 91 08/01/2021   LDLCALC 134 (H) 08/01/2021   TRIG 116 08/01/2021   Lab Results  Component Value Date   TSH 2.240 08/01/2021   No results found for: "HGBA1C" Lab Results  Component Value Date   WBC 4.9 06/03/2021   HGB 14.8 06/03/2021   HCT 41.5 06/03/2021   MCV 93.5 06/03/2021   PLT 241 06/03/2021   Lab Results  Component Value Date   ALT 16 06/03/2021   AST 19 06/03/2021   ALKPHOS 72 06/03/2021   BILITOT 0.8 06/03/2021   No results found for: "25OHVITD2", "25OHVITD3", "VD25OH"   Review of Systems  Constitutional:  Negative for fever.  Respiratory:  Negative for cough, shortness of breath and wheezing.   Cardiovascular:  Negative for chest pain and palpitations.  Gastrointestinal:   Negative for abdominal pain and blood in stool.  Musculoskeletal:  Negative for myalgias.  Neurological:  Negative for focal weakness.    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 02/10/2017   Pure hypercholesterolemia, unspecified 09/17/2016   Mild intermittent asthma, uncomplicated 08/65/7846   Gastroesophageal reflux disease without esophagitis 06/30/2015   Meniere's disease of both ears 06/30/2015   Situational anxiety 06/30/2015   Vitamin D deficiency 08/10/2011    Allergies  Allergen Reactions   Lorazepam Nausea Only and Other (See Comments)    Other Reaction: Nightmares Other Reaction: Nightmares Other Reaction: Nightmares Other Reaction: Nightmares Other Reaction: Nightmares Other Reaction: Nightmares    Zetia [Ezetimibe] Other (See Comments)    "Muscle weakness and did not feel good"    No past surgical history on file.  Social History   Tobacco Use   Smoking status: Never   Smokeless tobacco: Never  Vaping Use   Vaping Use: Never used  Substance Use Topics   Alcohol use: Yes    Comment: occasionally   Drug use: Never     Medication list has been reviewed and updated.  Current Meds  Medication Sig  acyclovir (ZOVIRAX) 400 MG tablet Take 1 tablet (400 mg total) by mouth 2 (two) times daily. (Patient taking differently: Take 400 mg by mouth daily.)   albuterol (VENTOLIN HFA) 108 (90 Base) MCG/ACT inhaler Inhale 2 puffs into the lungs every 4 (four) hours as needed.   ALPRAZolam (XANAX) 0.25 MG tablet Take 1 tablet by mouth daily as needed.   chlorpheniramine (CHLOR-TRIMETON) 4 MG tablet Take 1 tablet by mouth daily as needed.    Cholecalciferol 50 MCG (2000 UT) CAPS Take by mouth.   diphenhydrAMINE (SOMINEX) 25 MG tablet Take by mouth.   fexofenadine (ALLEGRA) 180 MG tablet Take by mouth.   fluticasone (FLONASE) 50 MCG/ACT nasal spray SPRAY 2 SPRAYS INTO EACH NOSTRIL EVERY DAY   ipratropium (ATROVENT) 0.06 % nasal spray Place 2 sprays into both nostrils 4 (four) times daily.   loratadine (CLARITIN) 10 MG tablet Take by mouth.   Multiple Vitamin (MULTIVITAMIN) tablet Take 1 tablet by mouth daily.   Olopatadine HCl 0.2 % SOLN Apply to eye.   omeprazole (PRILOSEC) 20 MG capsule TAKE 1 CAPSULE DAILY   Probiotic Product (DIGESTIVE ADVANTAGE) CAPS Take 1 capsule by mouth daily.   [DISCONTINUED] Misc Natural Products (ESTROVEN + ENERGY MAX STRENGTH) TABS Herbal Name: Extended Potassium + Magnesium oxide/citrate 1 capsule by mouth once daily       02/15/2022   11:29 AM 08/01/2021    9:19 AM 06/06/2021    3:29 PM  GAD 7 : Generalized Anxiety Score  Nervous, Anxious, on Edge 0 0 1  Control/stop worrying 0 0 1  Worry too much - different things 0 0 1  Trouble relaxing 0 0 0  Restless 0 0 0  Easily annoyed or irritable 0 0 0  Afraid - awful might happen 0 0 0  Total GAD 7 Score 0 0 3  Anxiety Difficulty Not difficult at all Not difficult at all Not difficult at all       02/15/2022   11:29 AM 12/20/2021    1:01 PM 08/01/2021    9:19 AM  Depression screen PHQ 2/9  Decreased Interest 0 0 0  Down, Depressed, Hopeless 0 3 0  PHQ - 2 Score 0 3 0  Altered sleeping 0 3 0  Tired, decreased energy 0 0 0  Change in appetite 0 0 0  Feeling bad or failure about yourself  0 0 0  Trouble concentrating 0 0 0  Moving slowly or fidgety/restless 0 0 0  Suicidal thoughts 0 0 0  PHQ-9 Score 0 6 0  Difficult doing work/chores Not difficult at all Not difficult at all Not difficult at all    BP Readings from Last 3 Encounters:  02/15/22 122/64  10/31/21 108/72  09/18/21 120/70    Physical Exam Vitals and nursing note  reviewed. Exam conducted with a chaperone present.  Constitutional:      General: She is not in acute distress.    Appearance: She is not diaphoretic.  HENT:     Head: Normocephalic and atraumatic.     Right Ear: Tympanic membrane and external ear normal.     Left Ear: Tympanic membrane and external ear normal.     Nose: Nose normal. No congestion or rhinorrhea.     Mouth/Throat:     Mouth: Mucous membranes are moist.  Neck:     Thyroid: No thyromegaly.     Vascular: No JVD.  Cardiovascular:     Rate and Rhythm: Normal rate and regular rhythm.  Heart sounds: Normal heart sounds, S1 normal and S2 normal. No murmur heard.    No systolic murmur is present.     No diastolic murmur is present.     No friction rub. No gallop. No S3 or S4 sounds.  Pulmonary:     Effort: Pulmonary effort is normal.     Breath sounds: Normal breath sounds. No wheezing, rhonchi or rales.  Abdominal:     General: Bowel sounds are normal.     Palpations: Abdomen is soft. There is no mass.     Tenderness: There is no abdominal tenderness. There is no guarding.  Musculoskeletal:        General: Normal range of motion.     Cervical back: Normal range of motion and neck supple.  Lymphadenopathy:     Cervical: No cervical adenopathy.  Skin:    General: Skin is warm and dry.  Neurological:     Mental Status: She is alert.     Wt Readings from Last 3 Encounters:  02/15/22 109 lb (49.4 kg)  10/31/21 110 lb 4 oz (50 kg)  09/18/21 113 lb (51.3 kg)    BP 122/64   Pulse 80   Ht 5' (1.524 m)   Wt 109 lb (49.4 kg)   BMI 21.29 kg/m   Assessment and Plan: 1. Familial hypercholesterolemia Chronic.  Controlled.  Stable.  Patient is intolerant of statins and Zetia.  Therefore we are only use in dietary control at this time.  Patient has an excellent HDL level which counters her elevated LDL and we will continue to treat in a dietary approach.  We will check lipid panel today to see what current status  is. - Lipid Panel With LDL/HDL Ratio  2. Viral syndrome Patient with history of motion sickness and with upcoming cruise would like to have scopolamine patches. - scopolamine (TRANSDERM-SCOP) 1 MG/3DAYS; Place 1 patch (1.5 mg total) onto the skin every 3 (three) days.  Dispense: 4 patch; Refill: 0  3. Motion sickness, subsequent encounter Patient has upcoming cruise for which she has had a history of motion sickness and would like to have access to scopolamine patches. - scopolamine (TRANSDERM-SCOP) 1 MG/3DAYS; Place 1 patch (1.5 mg total) onto the skin every 3 (three) days.  Dispense: 4 patch; Refill: 0  4. 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 - Sedimentation rate     Otilio Miu, MD

## 2022-02-16 ENCOUNTER — Telehealth: Payer: Self-pay

## 2022-02-16 NOTE — Telephone Encounter (Signed)
PA completed waiting on insurance approval.  Key: [id number]  KP

## 2022-02-19 NOTE — Telephone Encounter (Signed)
Approvedon October 13 Approved. This drug has been approved under the Member's Medicare Part D benefit. Approved quantity: 4 units per 12 day(s). You may fill up to a 90 day supply except for those on Specialty Tier 5, which can be filled up to a 30 day supply. Please call the pharmacy to process the prescription claim.  KP

## 2022-02-23 ENCOUNTER — Telehealth: Payer: Self-pay

## 2022-02-23 DIAGNOSIS — Z91148 Patient's other noncompliance with medication regimen for other reason: Secondary | ICD-10-CM | POA: Insufficient documentation

## 2022-02-23 DIAGNOSIS — Z789 Other specified health status: Secondary | ICD-10-CM | POA: Insufficient documentation

## 2022-02-23 LAB — LIPID PANEL WITH LDL/HDL RATIO
Cholesterol, Total: 300 mg/dL — ABNORMAL HIGH (ref 100–199)
HDL: 113 mg/dL (ref >39)
LDL Chol Calc (NIH): 165 mg/dL — ABNORMAL HIGH (ref 0–99)
LDL/HDL Ratio: 1.5 ratio (ref 0.0–3.2)
Triglycerides: 132 mg/dL (ref 0–149)
VLDL Cholesterol Cal: 22 mg/dL (ref 5–40)

## 2022-02-23 LAB — SEDIMENTATION RATE: Sed Rate: 2 mm/hr (ref 0–40)

## 2022-02-23 NOTE — Telephone Encounter (Signed)
Called pt concerning going on statin or something for cholesterol. Her numbers are elevated. Pt stated, "I am no worried about it, I am on my way to [place] in an airport now and seeing cardiology next month." I advised her to mention the elevated cholesterol to the cardiologist to see if they want her to try a different med for cholesterol as well.

## 2022-03-15 ENCOUNTER — Other Ambulatory Visit: Payer: Self-pay | Admitting: Family Medicine

## 2022-03-15 DIAGNOSIS — F5101 Primary insomnia: Secondary | ICD-10-CM

## 2022-03-15 NOTE — Telephone Encounter (Signed)
Unable to refill per protocol, Rx expired. Medication was discontinued 08/01/21 by PCP. Will refuse.  Requested Prescriptions  Pending Prescriptions Disp Refills   traZODone (DESYREL) 50 MG tablet [Pharmacy Med Name: TRAZODONE 50 MG TABLET] 90 tablet 2    Sig: TAKE 1/2 TO 1 TABLET BY MOUTH AT BEDTIME AS NEEDED FOR SLEEP     Psychiatry: Antidepressants - Serotonin Modulator Passed - 03/15/2022  2:35 AM      Passed - Valid encounter within last 6 months    Recent Outpatient Visits           4 weeks ago Familial hypercholesterolemia   [facility] [place], Deanna C, MD   5 months ago Cystitis   [facility] [place], [place], MD   7 months ago Irritable bowel syndrome with diarrhea   [facility] [place], Deanna C, MD   9 months ago Establishing care with new doctor, encounter for   [place] [facility] at [facility], MD       Future Appointments             In 5 months Juline Patch, MD [facility] at [facility], [facility]

## 2022-05-14 ENCOUNTER — Encounter: Payer: Self-pay | Admitting: Internal Medicine

## 2022-05-14 ENCOUNTER — Ambulatory Visit (INDEPENDENT_AMBULATORY_CARE_PROVIDER_SITE_OTHER): Payer: Medicare Other | Admitting: Internal Medicine

## 2022-05-14 VITALS — BP 130/82 | HR 109 | Temp 98.0°F | Resp 16 | Ht 60.0 in | Wt 110.0 lb

## 2022-05-14 DIAGNOSIS — R5383 Other fatigue: Secondary | ICD-10-CM | POA: Diagnosis not present

## 2022-05-14 DIAGNOSIS — A6000 Herpesviral infection of urogenital system, unspecified: Secondary | ICD-10-CM | POA: Diagnosis not present

## 2022-05-14 DIAGNOSIS — H8103 Meniere's disease, bilateral: Secondary | ICD-10-CM

## 2022-05-14 DIAGNOSIS — R3915 Urgency of urination: Secondary | ICD-10-CM

## 2022-05-14 DIAGNOSIS — K219 Gastro-esophageal reflux disease without esophagitis: Secondary | ICD-10-CM | POA: Diagnosis not present

## 2022-05-14 DIAGNOSIS — E559 Vitamin D deficiency, unspecified: Secondary | ICD-10-CM

## 2022-05-14 MED ORDER — VALACYCLOVIR HCL 1 G PO TABS: ORAL_TABLET | ORAL | 3 refills | Status: DC

## 2022-05-14 NOTE — Patient Instructions (Addendum)
It was great seeing you today!  Plan discussed at today's visit: -Blood work ordered today, results will be uploaded to [place].  -Valacyclovir sent to pharmacy  -Consider pneumonia vaccine   Follow up in: 6 months   Take care and let us know if you have any questions or concerns prior to your next visit.  Dr. Rosana Berger  Pneumococcal Conjugate Vaccine (PCV20) Injection What is this medication? PNEUMOCOCCAL CONJUGATE VACCINE (NEU mo KOK al kon ju gate vak SEEN) reduces the risk of pneumococcal disease, such as pneumonia. It does not treat pneumococcal disease. It is still possible to get pneumococcal disease after receiving this vaccine, but the symptoms may be less severe or not last as long. It works by helping your immune system learn how to fight off a future infection. This medicine may be used for other purposes; ask your health care provider or pharmacist if you have questions. COMMON BRAND NAME(S): Prevnar 20 What should I tell my care team before I take this medication? They need to know if you have any of these conditions: Bleeding disorder Fever Immune system problems An unusual or allergic reaction to pneumococcal vaccine, diphtheria toxoid, other vaccines, other medications, foods, dyes, or preservatives Pregnant or trying to get pregnant Breastfeeding How should I use this medication? This vaccine is injected into a muscle. It is given by your care team. A copy of Vaccine Information Statements will be given before each vaccination. Be sure to read this information carefully each time. This sheet may change often. Talk to your care team about the use of this medication in children. While it may be given to children as young as 6 weeks for selected conditions, precautions do apply. Overdosage: If you think you have taken too much of this medicine contact a poison control center or emergency room at once. NOTE: This medicine is only for you. Do not share this medicine with  others. What if I miss a dose? This does not apply. This medication is not for regular use. What may interact with this medication? Medications for cancer chemotherapy Medications that suppress your immune function Steroid medications, such as prednisone or cortisone This list may not describe all possible interactions. Give your health care provider a list of all the medicines, herbs, non-prescription drugs, or dietary supplements you use. Also tell them if you smoke, drink alcohol, or use illegal drugs. Some items may interact with your medicine. What should I watch for while using this medication? Visit your care team regularly. Report any side effects to your care team right away. This vaccine, like all vaccines, may not fully protect everyone. What side effects may I notice from receiving this medication? Side effects that you should report to your care team as soon as possible: Allergic reactions--skin rash, itching, hives, swelling of the face, lips, tongue, or throat Side effects that usually do not require medical attention (report these to your care team if they continue or are bothersome): Fatigue Fever Headache Joint pain Muscle pain Pain, redness, or irritation at injection site This list may not describe all possible side effects. Call your doctor for medical advice about side effects. You may report side effects to FDA at 1-800-FDA-1088. Where should I keep my medication? This vaccine is only given by your care team. It will not be stored at home. NOTE: This sheet is a summary. It may not cover all possible information. If you have questions about this medicine, talk to your doctor, pharmacist, or health care provider.  2023 Elsevier/Gold Standard (2021-10-03 00:00:00)  Evolocumab Injection What is this medication? EVOLOCUMAB (e voe LOK ue mab) treats high cholesterol. It may also be used to lower the risk of heart attack, stroke, and a type of heart surgery. It works by  decreasing bad cholesterol (such as LDL) in your blood. It is a monoclonal antibody. Changes to diet and exercise are often combined with this medication. This medicine may be used for other purposes; ask your health care provider or pharmacist if you have questions. COMMON BRAND NAME(S): Repatha, Repatha SureClick What should I tell my care team before I take this medication? They need to know if you have any of these conditions: An unusual or allergic reaction to evolocumab, latex, other medications, foods, dyes, or preservatives Pregnant or trying to get pregnant Breast-feeding How should I use this medication? This medication is injected under the skin. You will be taught how to prepare and give it. Take it as directed on the prescription label at the same time every day. Keep taking it unless your care team tells you to stop. It is important that you put your used needles and syringes in a special sharps container. Do not put them in a trash can. If you do not have a sharps container, call your pharmacist or care team to get one. This medication comes with INSTRUCTIONS FOR USE. Ask your pharmacist for directions on how to use this medication. Read the information carefully. Talk to your pharmacist or care team if you have questions. Talk to your care team about the use of this medication in children. While it may be prescribed for children as young as 10 years for selected conditions, precautions do apply. Overdosage: If you think you have taken too much of this medicine contact a poison control center or emergency room at once. NOTE: This medicine is only for you. Do not share this medicine with others. What if I miss a dose? It is important not to miss any doses. Talk to your care team about what to do if you miss a dose. What may interact with this medication? Interactions are not expected. This list may not describe all possible interactions. Give your health care provider a list of all  the medicines, herbs, non-prescription drugs, or dietary supplements you use. Also tell them if you smoke, drink alcohol, or use illegal drugs. Some items may interact with your medicine. What should I watch for while using this medication? Visit your care team for regular checks on your progress. Tell your care team if your symptoms do not start to get better or if they get worse. You may need blood work while you are taking this medication. Do not wear the on-body infuser during an MRI. Taking this medication is only part of a total heart healthy program. Ask your care team if there are other changes you can make to improve your overall health. What side effects may I notice from receiving this medication? Side effects that you should report to your care team as soon as possible: Allergic reactions or angioedema--skin rash, itching or hives, swelling of the face, eyes, lips, tongue, arms, or legs, trouble swallowing or breathing Side effects that usually do not require medical attention (report to your care team if they continue or are bothersome): Back pain Flu-like symptoms--fever, chills, muscle pain, cough, headache, fatigue Pain, redness, or irritation at injection site Runny or stuffy nose Sore throat This list may not describe all possible side effects. Call your doctor for  medical advice about side effects. You may report side effects to FDA at 1-800-FDA-1088. Where should I keep my medication? Keep out of the reach of children and pets. Store in a refrigerator or at room temperature between 20 and 25 degrees C (68 and 77 degrees F). Refrigeration (preferred): Store it in the refrigerator. Do not freeze. Keep it in the original carton until you are ready to take it. Remove the dose from the carton about 30 minutes before it is time for you to take it. Get rid of any unused medication after the expiration date. Room temperature: This medication may be stored at room temperature for up to  30 days. Keep it in the original carton until you are ready to take it. If it is stored at room temperature, get rid of any unused medication after 30 days or after it expires, whichever is first. Protect from light. Do not shake. Avoid exposure to extreme heat. To get rid of medications that are no longer needed or have expired: Take the medication to a medication take-back program. Check with your pharmacy or law enforcement to find a location. If you cannot return the medication, ask your pharmacist or care team how to get rid of this medication safely. NOTE: This sheet is a summary. It may not cover all possible information. If you have questions about this medicine, talk to your doctor, pharmacist, or health care provider.  2023 Elsevier/Gold Standard (2021-04-25 00:00:00)  Kegel Exercises  Kegel exercises can help strengthen your pelvic floor muscles. The pelvic floor is a group of muscles that support your rectum, small intestine, and bladder. In females, pelvic floor muscles also help support the uterus. These muscles help you control the flow of urine and stool (feces). Kegel exercises are painless and simple. They do not require any equipment. Your provider may suggest Kegel exercises to: Improve bladder and bowel control. Improve sexual response. Improve weak pelvic floor muscles after surgery to remove the uterus (hysterectomy) or after pregnancy, in females. Improve weak pelvic floor muscles after prostate gland removal or surgery, in males. Kegel exercises involve squeezing your pelvic floor muscles. These are the same muscles you squeeze when you try to stop the flow of urine or keep from passing gas. The exercises can be done while sitting, standing, or lying down, but it is best to vary your position. Ask your health care provider which exercises are safe for you. Do exercises exactly as told by your health care provider and adjust them as directed. Do not begin these exercises  until told by your health care provider. Exercises How to do Kegel exercises: Squeeze your pelvic floor muscles tight. You should feel a tight lift in your rectal area. If you are a female, you should also feel a tightness in your vaginal area. Keep your stomach, buttocks, and legs relaxed. Hold the muscles tight for up to 10 seconds. Breathe normally. Relax your muscles for up to 10 seconds. Repeat as told by your health care provider. Repeat this exercise daily as told by your health care provider. Continue to do this exercise for at least 4-6 weeks, or for as long as told by your health care provider. You may be referred to a physical therapist who can help you learn more about how to do Kegel exercises. Depending on your condition, your health care provider may recommend: Varying how long you squeeze your muscles. Doing several sets of exercises every day. Doing exercises for several weeks. Making Kegel exercises a  part of your regular exercise routine. This information is not intended to replace advice given to you by your health care provider. Make sure you discuss any questions you have with your health care provider. Document Revised: 09/01/2020 Document Reviewed: 09/01/2020 Elsevier Patient Education  [place].

## 2022-05-14 NOTE — Progress Notes (Signed)
New Patient Office Visit  Subjective    Patient ID: Shawna Peterson, female    DOB: 08/16/1945  Age: 77 y.o. MRN: [id number]  CC:  Chief Complaint  Patient presents with   Establish Care    HPI Shawna Peterson presents to establish care.  HLD: -Medications: Nothing currently, had been on Crestor but had myalgias -Is following with cardiology, actually has an appointment tomorrow to discuss other medications -Appears to be familial -Last lipid panel: Lipid Panel     Component Value Date/Time   CHOL 300 (H) 02/22/2022 0809   TRIG 132 02/22/2022 0809   HDL 113 02/22/2022 0809   LDLCALC 165 (H) 02/22/2022 0809   LABVLDL 22 02/22/2022 0809   Meniere's Disease:  -Currently on Meclizine, Mazide 37.5-25 mg as needed - hasn't had to take in 2 years.  -Currently has hearing aids bilateral leg  Genital herpes: -Has a flare about 3-4 times a year -Last flare 4 months ago -Has been taking daily antiviral prophylaxis but is only taking acyclovir twice daily as needed currently  Environmental Allergies: -Currently on Allegra, Flonase, Atrovent as needed   GERD: -On Prilosec 20 mg, symptoms well controlled   Would like to discuss some urinary urgency issues noted lately.  She denies all other urinary symptoms including dysuria and hematuria.  She occasionally has to wear a pad but not usually.  She has a history of one C-section.  Health Maintenance: -Blood work due  -Mammogram, 10/22 Birads 1 - had mammograms every 2 years -Cologuard 07/04/20 negative, due in 1 year  -Prevnar 20 due, literature printed for the patient  Outpatient Encounter Medications as of 05/14/2022  Medication Sig   acyclovir (ZOVIRAX) 400 MG tablet Take 1 tablet (400 mg total) by mouth 2 (two) times daily. (Patient taking differently: Take 400 mg by mouth as needed.)   albuterol (VENTOLIN HFA) 108 (90 Base) MCG/ACT inhaler Inhale 2 puffs into the lungs every 4 (four) hours as needed.   ALPRAZolam (XANAX)  0.25 MG tablet Take 1 tablet by mouth daily as needed.   chlorpheniramine (CHLOR-TRIMETON) 4 MG tablet Take 1 tablet by mouth daily as needed.   Cholecalciferol 50 MCG (2000 UT) CAPS Take by mouth.   dicyclomine (BENTYL) 20 MG tablet Take 1 tablet (20 mg total) by mouth every 8 (eight) hours.   diphenhydrAMINE (SOMINEX) 25 MG tablet Take by mouth.   fexofenadine (ALLEGRA) 180 MG tablet Take by mouth.   fluticasone (FLONASE) 50 MCG/ACT nasal spray SPRAY 2 SPRAYS INTO EACH NOSTRIL EVERY DAY   ipratropium (ATROVENT) 0.06 % nasal spray Place 2 sprays into both nostrils 4 (four) times daily.   loratadine (CLARITIN) 10 MG tablet Take by mouth.   meclizine (ANTIVERT) 25 MG tablet Take 25 mg by mouth 3 (three) times daily as needed for dizziness.   Multiple Vitamin (MULTIVITAMIN) tablet Take 1 tablet by mouth daily.   omeprazole (PRILOSEC) 20 MG capsule TAKE 1 CAPSULE DAILY   Probiotic Product (DIGESTIVE ADVANTAGE) CAPS Take 1 capsule by mouth daily.   scopolamine (TRANSDERM-SCOP) 1 MG/3DAYS Place 1 patch (1.5 mg total) onto the skin every 3 (three) days.   triamterene-hydrochlorothiazide (MAXZIDE-25) 37.5-25 MG tablet Take 1 tablet by mouth daily as needed.   traMADol (ULTRAM) 50 MG tablet Take 1 tablet by mouth every 6 (six) hours as needed. (Patient not taking: Reported on 02/15/2022)   [DISCONTINUED] Olopatadine HCl 0.2 % SOLN Apply to eye.   No facility-administered encounter medications on file as of  05/14/2022.    Past Medical History:  Diagnosis Date   Allergy 1964   Arthritis 2018   GERD (gastroesophageal reflux disease) 2010   Hyperlipidemia 1980   Menetrier's disease    Situational anxiety 06/30/2015    Past Surgical History:  Procedure Laterality Date   CESAREAN SECTION  1971   EYE SURGERY  2011    Family History  Problem Relation Age of Onset   Breast cancer Maternal Aunt    Hyperlipidemia Father     Social History   Socioeconomic History   Marital status: Widowed     Spouse name: Not on file   Number of children: 1   Years of education: Not on file   Highest education level: Not on file  Occupational History   Occupation: Retired  Tobacco Use   Smoking status: Never   Smokeless tobacco: Never   Tobacco comments:    Never smoked  Vaping Use   Vaping Use: Never used  Substance and Sexual Activity   Alcohol use: Yes    Alcohol/week: 2.0 standard drinks of alcohol    Types: 2 Glasses of wine per week    Comment: occasionally   Drug use: Never   Sexual activity: Not Currently    Birth control/protection: None  Other Topics Concern   Not on file  Social History Narrative   Not on file   Social Determinants of Health   Financial Resource Strain: Low Risk  (12/20/2021)   Overall Financial Resource Strain (CARDIA)    Difficulty of Paying Living Expenses: Not hard at all  Food Insecurity: No Food Insecurity (12/20/2021)   Hunger Vital Sign    Worried About Running Out of Food in the Last Year: Never true    Ran Out of Food in the Last Year: Never true  Transportation Needs: No Transportation Needs (12/20/2021)   PRAPARE - Hydrologist (Medical): No    Lack of Transportation (Non-Medical): No  Physical Activity: Sufficiently Active (12/20/2021)   Exercise Vital Sign    Days of Exercise per Week: 3 days    Minutes of Exercise per Session: 60 min  Stress: No Stress Concern Present (12/20/2021)   [place]    Feeling of Stress : Only a little  Social Connections: Moderately Isolated (12/20/2021)   Social Connection and Isolation Panel [NHANES]    Frequency of Communication with Friends and Family: Twice a week    Frequency of Social Gatherings with Friends and Family: Twice a week    Attends Religious Services: Never    Marine scientist or Organizations: Yes    Attends Music therapist: More than 4 times per year    Marital Status:  Widowed  Intimate Partner Violence: Not on file    Review of Systems  All other systems reviewed and are negative.       Objective    BP 130/82   Pulse (!) 109   Temp 98 F (36.7 C)   Resp 16   Ht 5' (1.524 m)   Wt 110 lb (49.9 kg)   SpO2 97%   BMI 21.48 kg/m   Physical Exam Constitutional:      Appearance: Normal appearance.  HENT:     Head: Normocephalic and atraumatic.     Right Ear: Tympanic membrane, ear canal and external ear normal.     Left Ear: Tympanic membrane, ear canal and external ear normal.  Mouth/Throat:     Mouth: Mucous membranes are moist.     Pharynx: Oropharynx is clear.  Eyes:     Extraocular Movements: Extraocular movements intact.     Conjunctiva/sclera: Conjunctivae normal.     Pupils: Pupils are equal, round, and reactive to light.  Cardiovascular:     Rate and Rhythm: Normal rate and regular rhythm.  Pulmonary:     Effort: Pulmonary effort is normal.     Breath sounds: Normal breath sounds.  Musculoskeletal:     Cervical back: No tenderness.     Right lower leg: No edema.     Left lower leg: No edema.  Lymphadenopathy:     Cervical: No cervical adenopathy.  Skin:    General: Skin is warm and dry.  Neurological:     General: No focal deficit present.     Mental Status: She is alert. Mental status is at baseline.  Psychiatric:        Mood and Affect: Mood normal.        Behavior: Behavior normal.         Assessment & Plan:   1. Meniere's disease of both ears: Stable, has meclizine and Maxide she takes as needed for symptoms which she has not had to take in several years.  She does wear hearing aids bilaterally.  Check CBC and CMP today.  - CBC w/Diff/Platelet - COMPLETE METABOLIC PANEL WITH GFR  2. Genital herpes simplex, unspecified site: Will switch from acyclovir to valacyclovir 1 g twice daily as needed for flares, this was sent to her pharmacy.  Having flares about 3-4 times a year, is not on daily  prophylaxis.  - valACYclovir (VALTREX) 1000 MG tablet; Take 1 gram twice a day as needed for flares.  Dispense: 30 tablet; Refill: 3  3. Gastroesophageal reflux disease, unspecified whether esophagitis present: Chronic and stable.  Continue Prilosec 20 mg as needed.  4. Other fatigue: Check thyroid function today. - TSH  5. Vitamin D deficiency: Vitamin D levels checked today.  She is on over-the-counter supplements.  - Vitamin D (25 hydroxy)  6. Urinary urgency: No other acute urinary symptoms, no urinary incontinence.  Discussed regular Kegel exercises and if symptoms progress we will do a pelvic exam and potentially refer to pelvic floor physical therapy.   Return in about 6 months (around 11/12/2022).   Teodora Medici, DO

## 2022-05-15 ENCOUNTER — Encounter: Payer: Self-pay | Admitting: Internal Medicine

## 2022-05-15 LAB — CBC WITH DIFFERENTIAL/PLATELET
Absolute Monocytes: 601 cells/uL (ref 200–950)
Basophils Absolute: 31 cells/uL (ref 0–200)
Basophils Relative: 0.5 %
Eosinophils Absolute: 149 cells/uL (ref 15–500)
Eosinophils Relative: 2.4 %
HCT: 41.4 % (ref 35.0–45.0)
Hemoglobin: 15 g/dL (ref 11.7–15.5)
Lymphs Abs: 1277 cells/uL (ref 850–3900)
MCH: 32.8 pg (ref 27.0–33.0)
MCHC: 36.2 g/dL — ABNORMAL HIGH (ref 32.0–36.0)
MCV: 90.6 fL (ref 80.0–100.0)
MPV: 9.8 fL (ref 7.5–12.5)
Monocytes Relative: 9.7 %
Neutro Abs: 4142 cells/uL (ref 1500–7800)
Neutrophils Relative %: 66.8 %
Platelets: 247 10*3/uL (ref 140–400)
RBC: 4.57 10*6/uL (ref 3.80–5.10)
RDW: 11.9 % (ref 11.0–15.0)
Total Lymphocyte: 20.6 %
WBC: 6.2 10*3/uL (ref 3.8–10.8)

## 2022-05-15 LAB — COMPLETE METABOLIC PANEL WITH GFR
AG Ratio: 1.8 (calc) (ref 1.0–2.5)
ALT: 18 U/L (ref 6–29)
AST: 18 U/L (ref 10–35)
Albumin: 4.6 g/dL (ref 3.6–5.1)
Alkaline phosphatase (APISO): 96 U/L (ref 37–153)
BUN: 22 mg/dL (ref 7–25)
CO2: 27 mmol/L (ref 20–32)
Calcium: 9.9 mg/dL (ref 8.6–10.4)
Chloride: 103 mmol/L (ref 98–110)
Creat: 0.73 mg/dL (ref 0.60–1.00)
Globulin: 2.6 g/dL (calc) (ref 1.9–3.7)
Glucose, Bld: 93 mg/dL (ref 65–99)
Potassium: 4.5 mmol/L (ref 3.5–5.3)
Sodium: 141 mmol/L (ref 135–146)
Total Bilirubin: 0.4 mg/dL (ref 0.2–1.2)
Total Protein: 7.2 g/dL (ref 6.1–8.1)
eGFR: 85 mL/min/{1.73_m2} (ref >=60)

## 2022-05-15 LAB — VITAMIN D 25 HYDROXY (VIT D DEFICIENCY, FRACTURES): Vit D, 25-Hydroxy: 26 ng/mL — ABNORMAL LOW (ref 30–100)

## 2022-05-15 LAB — TSH: TSH: 3.07 mIU/L (ref 0.40–4.50)

## 2022-05-15 MED ORDER — VITAMIN D (ERGOCALCIFEROL) 1.25 MG (50000 UNIT) PO CAPS: 50000.0000 [IU] | ORAL_CAPSULE | ORAL | 0 refills | Status: DC

## 2022-05-15 NOTE — Addendum Note (Signed)
Addended by: Teodora Medici on: 05/15/2022 08:00 AM   Modules accepted: Orders

## 2022-05-16 ENCOUNTER — Encounter: Payer: Self-pay | Admitting: Internal Medicine

## 2022-05-21 ENCOUNTER — Other Ambulatory Visit: Payer: Self-pay | Admitting: Internal Medicine

## 2022-05-21 DIAGNOSIS — A6000 Herpesviral infection of urogenital system, unspecified: Secondary | ICD-10-CM

## 2022-05-21 NOTE — Telephone Encounter (Signed)
Requested Prescriptions  Pending Prescriptions Disp Refills   valACYclovir (VALTREX) 1000 MG tablet [Pharmacy Med Name: VALACYCLOVIR HCL 1 GRAM TABLET] 180 tablet 0    Sig: TAKE 1 GRAM TWICE A DAY AS NEEDED FOR FLARES.     Antimicrobials:  Antiviral Agents - Anti-Herpetic Passed - 05/21/2022  9:32 AM      Passed - Valid encounter within last 12 months    Recent Outpatient Visits           1 week ago Meniere's disease of both [place], DO   3 months ago Familial hypercholesterolemia   [facility] [place], Shawna C, MD   8 months ago Cystitis   [facility] [place], [place], MD   9 months ago Irritable bowel syndrome with diarrhea   [facility] [place], Shawna C, MD   11 months ago Establishing care with new doctor, encounter for   [facility] [place], [place], MD       Future Appointments             In 5 months Shawna Peterson, [facility], [facility]

## 2022-07-05 ENCOUNTER — Other Ambulatory Visit: Payer: Self-pay | Admitting: Internal Medicine

## 2022-07-05 DIAGNOSIS — K580 Irritable bowel syndrome with diarrhea: Secondary | ICD-10-CM

## 2022-07-05 NOTE — Telephone Encounter (Signed)
Copied from [place] [id number]. Topic: General - Other >> Jul 05, 2022  4:13 PM Everette C wrote: Reason for CRM: Medication Refill - Medication: albuterol (VENTOLIN HFA) 108 (90 Base) MCG/ACT inhaler [id number]  omeprazole (PRILOSEC) 20 MG capsule [id number]  Has the patient contacted their pharmacy? Yes.   (Agent: If no, request that the patient contact the pharmacy for the refill. If patient does not wish to contact the pharmacy document the reason why and proceed with request.) (Agent: If yes, when and what did the pharmacy advise?)  Preferred Pharmacy (with phone number or street name): [id number] [place] [address] [id number]: [phone]: [phone]: Not open 24 hours   Has the patient been seen for an appointment in the last year OR does the patient have an upcoming appointment? Yes.    Agent: Please be advised that RX refills may take up to 3 business days. We ask that you follow-up with your pharmacy.

## 2022-07-06 NOTE — Telephone Encounter (Signed)
Requested medication (s) are due for refill today: yes  Requested medication (s) are on the active medication list: yes  Last refill:  10/27/21 and 01/29/22  Future visit scheduled: yes  Notes to clinic:  Unable to refill per protocol, last refill by another provider.      Requested Prescriptions  Pending Prescriptions Disp Refills   albuterol (VENTOLIN HFA) 108 (90 Base) MCG/ACT inhaler      Sig: Inhale 2 puffs into the lungs every 4 (four) hours as needed.     Pulmonology:  Beta Agonists 2 Passed - 07/05/2022  5:28 PM      Passed - Last BP in normal range    BP Readings from Last 1 Encounters:  05/14/22 130/82         Passed - Last Heart Rate in normal range    Pulse Readings from Last 1 Encounters:  05/14/22 (!) 109         Passed - Valid encounter within last 12 months    Recent Outpatient Visits           1 month ago Meniere's disease of both [facility] Teodora Medici, DO   4 months ago Familial hypercholesterolemia   [place] [facility] [place], Deanna C, MD   9 months ago [place] [facility] [place], [place], MD   11 months ago Irritable bowel syndrome with diarrhea    [place] [facility] [place], Deanna C, MD   1 year ago Establishing care with new doctor, encounter for   [place] [facility] [place], [place], MD       Future Appointments             In 4 months Teodora Medici, [facility]             omeprazole (PRILOSEC) 20 MG capsule 90 capsule 2    Sig: Take 1 capsule (20 mg total) by mouth daily.     Gastroenterology: Proton Pump Inhibitors Passed - 07/05/2022  5:28 PM      Passed - Valid encounter within last 12 months    Recent Outpatient Visits           1 month ago Meniere's disease of both  [facility] Teodora Medici, DO   4 months ago Familial hypercholesterolemia   [place] [facility] [place], Deanna C, MD   9 months ago [place] [facility] [place], [place], MD   11 months ago Irritable bowel syndrome with diarrhea   [facility] [place], Deanna C, MD   1 year ago Establishing care with new doctor, encounter for   [place] [facility] [place], [place], MD       Future Appointments             In 4 months Teodora Medici, [facility], [facility]

## 2022-07-08 ENCOUNTER — Encounter: Payer: Self-pay | Admitting: Internal Medicine

## 2022-07-09 ENCOUNTER — Other Ambulatory Visit: Payer: Self-pay

## 2022-07-09 DIAGNOSIS — K580 Irritable bowel syndrome with diarrhea: Secondary | ICD-10-CM

## 2022-07-09 MED ORDER — OMEPRAZOLE 20 MG PO CPDR: 20.0000 mg | DELAYED_RELEASE_CAPSULE | Freq: Every day | ORAL | 1 refills | Status: DC

## 2022-07-09 MED ORDER — ALBUTEROL SULFATE HFA 108 (90 BASE) MCG/ACT IN AERS: 2.0000 | INHALATION_SPRAY | RESPIRATORY_TRACT | 1 refills | Status: DC | PRN

## 2022-07-30 ENCOUNTER — Encounter: Payer: Self-pay | Admitting: Internal Medicine

## 2022-07-30 DIAGNOSIS — H9193 Unspecified hearing loss, bilateral: Secondary | ICD-10-CM

## 2022-07-31 ENCOUNTER — Telehealth: Payer: Self-pay | Admitting: Internal Medicine

## 2022-07-31 NOTE — Telephone Encounter (Signed)
Contacted Josefine Class to schedule their annual wellness visit. Appointment made for 08/24/2022.  [place] Direct Dial: [phone]

## 2022-08-17 ENCOUNTER — Ambulatory Visit: Payer: Medicare Other | Admitting: Family Medicine

## 2022-08-21 ENCOUNTER — Encounter: Payer: Self-pay | Admitting: Internal Medicine

## 2022-08-24 ENCOUNTER — Ambulatory Visit (INDEPENDENT_AMBULATORY_CARE_PROVIDER_SITE_OTHER): Payer: Medicare Other

## 2022-08-24 ENCOUNTER — Other Ambulatory Visit: Payer: Self-pay | Admitting: Internal Medicine

## 2022-08-24 VITALS — Ht 60.0 in | Wt 110.0 lb

## 2022-08-24 DIAGNOSIS — A6000 Herpesviral infection of urogenital system, unspecified: Secondary | ICD-10-CM

## 2022-08-24 DIAGNOSIS — Z0000 Encounter for general adult medical examination without abnormal findings: Secondary | ICD-10-CM | POA: Diagnosis not present

## 2022-08-24 NOTE — Telephone Encounter (Signed)
Requested Prescriptions  Pending Prescriptions Disp Refills   valACYclovir (VALTREX) 1000 MG tablet [Pharmacy Med Name: VALACYCLOVIR HCL 1 GRAM TABLET] 180 tablet 0    Sig: TAKE 1 GRAM TWICE A DAY AS NEEDED FOR FLARES.     Antimicrobials:  Antiviral Agents - Anti-Herpetic Passed - 08/24/2022  1:41 AM      Passed - Valid encounter within last 12 months    Recent Outpatient Visits           3 months ago Meniere's disease of both ears   [place] [facility] Margarita Mail, DO   6 months ago Familial hypercholesterolemia   [place] [facility]as Inches, MD   11 months ago Cystitis   [place] [facility] Phineas Inches, MD   1 year ago Irritable bowel syndrome with diarrhea   [place] [facility] Inches, MD   1 year ago Establishing care with new doctor, encounter for   [facility]ineas Inches, MD       Future Appointments             In 2 months Margarita Mail, DO [facility], [facility]

## 2022-08-24 NOTE — Patient Instructions (Signed)
Shawna Peterson , Thank you for taking time to come for your Medicare Wellness Visit. I appreciate your ongoing commitment to your health goals. Please review the following plan we discussed and let me know if I can assist you in the future.   These are the goals we discussed:  Goals      Stay Active and Independent     Why is this important?   Regular activity or exercise is important to managing back pain.  Activity helps to keep your muscles strong.  You will sleep better and feel more relaxed.  You will have more energy and feel less stressed.  If you are not active now, start slowly. Little changes make a big difference.  Rest, but not too much.  Stay as active as you can and listen to your body's signals.             This is a list of the screening recommended for you and due dates:  Health Maintenance  Topic Date Due   Hepatitis C Screening: USPSTF Recommendation to screen - Ages 37-79 yo.  Never done   Zoster (Shingles) Vaccine (1 of 2) Never done   DEXA scan (bone density measurement)  Never done   COVID-19 Vaccine (3 - Pfizer risk series) 07/20/2019   Flu Shot  12/06/2022   Medicare Annual Wellness Visit  08/24/2023   DTaP/Tdap/Td vaccine (6 - Td or Tdap) 05/18/2024   Pneumonia Vaccine  Completed   HPV Vaccine  Aged Out    Advanced directives: yes  Conditions/risks identified: low falls risk  Next appointment: Follow up in one year for your annual wellness visit 08/30/2023 :15am telephone   Preventive Care 65 Years and Older, Female Preventive care refers to lifestyle choices and visits with your health care provider that can promote health and wellness. What does preventive care include? A yearly physical exam. This is also called an annual well check. Dental exams once or twice a year. Routine eye exams. Ask your health care provider how often you should have your eyes checked. Personal lifestyle choices, including: Daily care of your teeth and gums. Regular  physical activity. Eating a healthy diet. Avoiding tobacco and drug use. Limiting alcohol use. Practicing safe sex. Taking low-dose aspirin every day. Taking vitamin and mineral supplements as recommended by your health care provider. What happens during an annual well check? The services and screenings done by your health care provider during your annual well check will depend on your age, overall health, lifestyle risk factors, and family history of disease. Counseling  Your health care provider may ask you questions about your: Alcohol use. Tobacco use. Drug use. Emotional well-being. Home and relationship well-being. Sexual activity. Eating habits. History of falls. Memory and ability to understand (cognition). Work and work Astronomer. Reproductive health. Screening  You may have the following tests or measurements: Height, weight, and BMI. Blood pressure. Lipid and cholesterol levels. These may be checked every 5 years, or more frequently if you are over 67 years old. Skin check. Lung cancer screening. You may have this screening every year starting at age 53 if you have a 30-pack-year history of smoking and currently smoke or have quit within the past 15 years. Fecal occult blood test (FOBT) of the stool. You may have this test every year starting at age 17. Flexible sigmoidoscopy or colonoscopy. You may have a sigmoidoscopy every 5 years or a colonoscopy every 10 years starting at age 60. Hepatitis C blood test. Hepatitis B  blood test. Sexually transmitted disease (STD) testing. Diabetes screening. This is done by checking your blood sugar (glucose) after you have not eaten for a while (fasting). You may have this done every 1-3 years. Bone density scan. This is done to screen for osteoporosis. You may have this done starting at age 57. Mammogram. This may be done every 1-2 years. Talk to your health care provider about how often you should have regular mammograms. Talk  with your health care provider about your test results, treatment options, and if necessary, the need for more tests. Vaccines  Your health care provider may recommend certain vaccines, such as: Influenza vaccine. This is recommended every year. Tetanus, diphtheria, and acellular pertussis (Tdap, Td) vaccine. You may need a Td booster every 10 years. Zoster vaccine. You may need this after age 23. Pneumococcal 13-valent conjugate (PCV13) vaccine. One dose is recommended after age 74. Pneumococcal polysaccharide (PPSV23) vaccine. One dose is recommended after age 43. Talk to your health care provider about which screenings and vaccines you need and how often you need them. This information is not intended to replace advice given to you by your health care provider. Make sure you discuss any questions you have with your health care provider. Document Released: 05/20/2015 Document Revised: 01/11/2016 Document Reviewed: 02/22/2015 Elsevier Interactive Patient Education  2017 [place] Prevention in the Home Falls can cause injuries. They can happen to people of all ages. There are many things you can do to make your home safe and to help prevent falls. What can I do on the outside of my home? Regularly fix the edges of walkways and driveways and fix any cracks. Remove anything that might make you trip as you walk through a door, such as a raised step or threshold. Trim any bushes or trees on the path to your home. Use bright outdoor lighting. Clear any walking paths of anything that might make someone trip, such as rocks or tools. Regularly check to see if handrails are loose or broken. Make sure that both sides of any steps have handrails. Any raised decks and porches should have guardrails on the edges. Have any leaves, snow, or ice cleared regularly. Use sand or salt on walking paths during winter. Clean up any spills in your garage right away. This includes oil or grease  spills. What can I do in the bathroom? Use night lights. Install grab bars by the toilet and in the tub and shower. Do not use towel bars as grab bars. Use non-skid mats or decals in the tub or shower. If you need to sit down in the shower, use a plastic, non-slip stool. Keep the floor dry. Clean up any water that spills on the floor as soon as it happens. Remove soap buildup in the tub or shower regularly. Attach bath mats securely with double-sided non-slip rug tape. Do not have throw rugs and other things on the floor that can make you trip. What can I do in the bedroom? Use night lights. Make sure that you have a light by your bed that is easy to reach. Do not use any sheets or blankets that are too big for your bed. They should not hang down onto the floor. Have a firm chair that has side arms. You can use this for support while you get dressed. Do not have throw rugs and other things on the floor that can make you trip. What can I do in the kitchen? Clean up any spills  right away. Avoid walking on wet floors. Keep items that you use a lot in easy-to-reach places. If you need to reach something above you, use a strong step stool that has a grab bar. Keep electrical cords out of the way. Do not use floor polish or wax that makes floors slippery. If you must use wax, use non-skid floor wax. Do not have throw rugs and other things on the floor that can make you trip. What can I do with my stairs? Do not leave any items on the stairs. Make sure that there are handrails on both sides of the stairs and use them. Fix handrails that are broken or loose. Make sure that handrails are as long as the stairways. Check any carpeting to make sure that it is firmly attached to the stairs. Fix any carpet that is loose or worn. Avoid having throw rugs at the top or bottom of the stairs. If you do have throw rugs, attach them to the floor with carpet tape. Make sure that you have a light switch at the  top of the stairs and the bottom of the stairs. If you do not have them, ask someone to add them for you. What else can I do to help prevent falls? Wear shoes that: Do not have high heels. Have rubber bottoms. Are comfortable and fit you well. Are closed at the toe. Do not wear sandals. If you use a stepladder: Make sure that it is fully opened. Do not climb a closed stepladder. Make sure that both sides of the stepladder are locked into place. Ask someone to hold it for you, if possible. Clearly mark and make sure that you can see: Any grab bars or handrails. First and last steps. Where the edge of each step is. Use tools that help you move around (mobility aids) if they are needed. These include: Canes. Walkers. Scooters. Crutches. Turn on the lights when you go into a dark area. Replace any light bulbs as soon as they burn out. Set up your furniture so you have a clear path. Avoid moving your furniture around. If any of your floors are uneven, fix them. If there are any pets around you, be aware of where they are. Review your medicines with your doctor. Some medicines can make you feel dizzy. This can increase your chance of falling. Ask your doctor what other things that you can do to help prevent falls. This information is not intended to replace advice given to you by your health care provider. Make sure you discuss any questions you have with your health care provider. Document Released: 02/17/2009 Document Revised: 09/29/2015 Document Reviewed: 05/28/2014 Elsevier Interactive Patient Education  2017 [employer].

## 2022-08-24 NOTE — Progress Notes (Addendum)
I connected with  Shawna Peterson on 08/24/22 by a audio enabled telemedicine application and verified that I am speaking with the correct person using two identifiers.  Patient Location: Home  Provider Location: Office/Clinic  I discussed the limitations of evaluation and management by telemedicine. The patient expressed understanding and agreed to proceed.  Subjective:   Shawna Peterson is a 77 y.o. female who presents for Medicare Annual (Subsequent) preventive examination.  Review of Systems    Cardiac Risk Factors include: advanced age (>45men, >61 women);dyslipidemia     Objective:    Today's Vitals   08/24/22 0815  Weight: 110 lb (49.9 kg)  Height: 5' (1.524 m)   Body mass index is 21.48 kg/m.     08/24/2022    8:21 AM 07/29/2021   12:17 PM  Advanced Directives  Does Patient Have a Medical Advance Directive? 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   ALPRAZolam (XANAX) 0.25 MG tablet Take 1 tablet by mouth daily as needed.   chlorpheniramine (CHLOR-TRIMETON) 4 MG tablet Take 1 tablet by mouth daily as needed.   Cholecalciferol 50 MCG (2000 UT) CAPS Take by mouth.   dicyclomine (BENTYL) 20 MG tablet Take 1 tablet (20 mg total) by mouth every 8 (eight) hours.   diphenhydrAMINE (SOMINEX) 25 MG tablet Take by mouth.   fexofenadine (ALLEGRA) 180 MG tablet Take by mouth.   fluticasone (FLONASE) 50 MCG/ACT nasal spray SPRAY 2 SPRAYS INTO EACH NOSTRIL EVERY DAY   ipratropium (ATROVENT) 0.06 % nasal spray Place 2 sprays into both nostrils 4 (four) times daily.   loratadine (CLARITIN) 10 MG tablet Take by mouth.   meclizine (ANTIVERT) 25 MG tablet Take 25 mg by mouth 3 (three) times daily as needed for dizziness.   Multiple  Vitamin (MULTIVITAMIN) tablet Take 1 tablet by mouth daily.   omeprazole (PRILOSEC) 20 MG capsule Take 1 capsule (20 mg total) by mouth daily.   Probiotic Product (DIGESTIVE ADVANTAGE) CAPS Take 1 capsule by mouth daily.   scopolamine (TRANSDERM-SCOP) 1 MG/3DAYS Place 1 patch (1.5 mg total) onto the skin every 3 (three) days.   triamterene-hydrochlorothiazide (MAXZIDE-25) 37.5-25 MG tablet Take 1 tablet by mouth daily as needed.   valACYclovir (VALTREX) 1000 MG tablet TAKE 1 GRAM TWICE A DAY AS NEEDED FOR FLARES.   Vitamin D, Ergocalciferol, (DRISDOL) 1.25 MG (50000 UNIT) CAPS capsule Take 1 capsule (50,000 Units total) by mouth every 7 (seven) days.   traMADol (ULTRAM) 50 MG tablet Take 1 tablet by mouth every 6 (six) hours as needed. (Patient not taking: Reported on 02/15/2022)   No facility-administered encounter medications on file as of 08/24/2022.    Allergies (verified) Lorazepam and Zetia [ezetimibe]   History: Past Medical History:  Diagnosis Date   Allergy 1964   Arthritis 2018   GERD (gastroesophageal reflux disease) 2010   Hyperlipidemia 1980   Menetrier's disease    Situational anxiety 06/30/2015   Past Surgical History:  Procedure Laterality Date   CESAREAN SECTION  1971   EYE SURGERY  2011   Family History  Problem Relation Age of Onset   Breast cancer Maternal Aunt    Hyperlipidemia Father    Social History   Socioeconomic History   Marital status: Widowed  Spouse name: Not on file   Number of children: 1   Years of education: Not on file   Highest education level: Not on file  Occupational History   Occupation: Retired  Tobacco Use   Smoking status: Never   Smokeless tobacco: Never   Tobacco comments:    Never smoked  Vaping Use   Vaping Use: Never used  Substance and Sexual Activity   Alcohol use: Yes    Alcohol/week: 2.0 standard drinks of alcohol    Types: 2 Glasses of wine per week    Comment: occasionally   Drug use: Never   Sexual  activity: Not Currently    Birth control/protection: None  Other Topics Concern   Not on file  Social History Narrative   Not on file   Social Determinants of Health   Financial Resource Strain: Low Risk  (08/20/2022)   Overall Financial Resource Strain (CARDIA)    Difficulty of Paying Living Expenses: Not hard at all  Food Insecurity: No Food Insecurity (08/20/2022)   Hunger Vital Sign    Worried About Running Out of Food in the Last Year: Never true    Ran Out of Food in the Last Year: Never true  Transportation Needs: No Transportation Needs (08/20/2022)   PRAPARE - Administrator, Civil Service (Medical): No    Lack of Transportation (Non-Medical): No  Physical Activity: Insufficiently Active (08/20/2022)   Exercise Vital Sign    Days of Exercise per Week: 3 days    Minutes of Exercise per Session: 40 min  Stress: No Stress Concern Present (08/20/2022)   [employer] of Occupational Health - Occupational Stress Questionnaire    Feeling of Stress : Not at all  Social Connections: Socially Isolated (08/20/2022)   Social Connection and Isolation Panel [NHANES]    Frequency of Communication with Friends and Family: Once a week    Frequency of Social Gatherings with Friends and Family: Once a week    Attends Religious Services: Never    Database administrator or Organizations: No    Attends Banker Meetings: Never    Marital Status: Widowed    Tobacco Counseling Counseling given: Not Answered Tobacco comments: Never smoked   Clinical Intake:  Pre-visit preparation completed: Yes  Pain : No/denies pain     BMI - recorded: 21.48 Nutritional Risks: None Diabetes: No  How often do you need to have someone help you when you read instructions, pamphlets, or other written materials from your doctor or pharmacy?: 1 - Never  Diabetic?no  Interpreter Needed?: No  Information entered by :: B.Aasim Restivo,LPN   Activities of Daily Living     08/20/2022   11:49 AM 05/14/2022   10:57 AM  In your present state of health, do you have any difficulty performing the following activities:  Hearing? 1 1  Vision? 0 0  Difficulty concentrating or making decisions? 0 0  Walking or climbing stairs? 0 0  Dressing or bathing? 0 0  Doing errands, shopping? 0 0  Preparing Food and eating ? N   Using the Toilet? N   In the past six months, have you accidently leaked urine? N   Do you have problems with loss of bowel control? N   Managing your Medications? N   Managing your Finances? N   Housekeeping or managing your Housekeeping? N     Patient Care Team: Margarita Mail, DO as PCP - General (Internal Medicine)  Indicate any recent Medical Services  you may have received from other than [facility] providers in the past year (date may be approximate).     Assessment:   This is a routine wellness examination for Shawna Peterson.  Hearing/Vision screen Hearing Screening - Comments:: Adequate hearing with hearing aides Vision Screening - Comments:: Adequate vision w/glasses Dr Brooke Dare  Dietary issues and exercise activities discussed: Current Exercise Habits: Structured exercise class, Type of exercise: strength training/weights;treadmill;stretching, Time (Minutes): 30, Frequency (Times/Week): 5, Weekly Exercise (Minutes/Week): 150, Intensity: Mild, Exercise limited by: neurologic condition(s)   Goals Addressed   None    Depression Screen    08/24/2022    8:20 AM 05/14/2022   10:57 AM 02/15/2022   11:29 AM 12/20/2021    1:01 PM 08/01/2021    9:19 AM 06/06/2021    3:29 PM  PHQ 2/9 Scores  PHQ - 2 Score 0 0 0 3 0 1  PHQ- 9 Score  0 0 6 0 3    Fall Risk    08/20/2022   11:49 AM 05/14/2022   10:57 AM 02/15/2022   11:29 AM 12/16/2021    8:50 AM 06/06/2021    3:28 PM  Fall Risk   Falls in the past year? 0 0 0 0 0  Number falls in past yr: 0 0 0  0  Injury with Fall? 0 0 0  0  Risk for fall due to :   No Fall Risks  No Fall Risks;Impaired  balance/gait  Follow up   Falls evaluation completed  Falls evaluation completed    FALL RISK PREVENTION PERTAINING TO THE HOME:  Any stairs in or around the home? Yes  If so, are there any without handrails? Yes  Home free of loose throw rugs in walkways, pet beds, electrical cords, etc? Yes  Adequate lighting in your home to reduce risk of falls? Yes   ASSISTIVE DEVICES UTILIZED TO PREVENT FALLS:  Life alert? No  Use of a cane, walker or w/c? Yes cane Grab bars in the bathroom? No  Shower chair or bench in shower? Yes  Elevated toilet seat or a handicapped toilet? No     Cognitive Function:        08/24/2022    8:21 AM 12/20/2021    1:06 PM  6CIT Screen  What Year? 0 points 0 points  What month? 0 points 0 points  What time? 0 points 0 points  Count back from 20 0 points 0 points  Months in reverse 0 points 0 points  Repeat phrase 0 points 0 points  Total Score 0 points 0 points    Immunizations Immunization History  Administered Date(s) Administered   DTaP 02/05/2003   Dtap, Unspecified 02/05/2003   Fluad Quad(high Dose 65+) 02/06/2017, 01/26/2019   Hepatitis A 12/30/2012   Hepatitis A, Adult 12/30/2012, 07/10/2013   Hepatitis A, Ped/Adol-2 Dose 12/30/2012   Hepatitis B 02/05/2000, 05/02/2006   Hepatitis B, ADULT 02/05/2000, 05/02/2006   Hepatitis B, PED/ADOLESCENT 02/05/2000, 05/02/2006   Influenza Nasal 02/04/2017   Influenza, High Dose Seasonal PF 01/30/2014, 01/30/2014, 02/09/2015, 02/09/2015, 02/06/2017, 02/06/2017, 02/26/2018, 02/26/2018, 01/26/2019, 01/26/2019   Influenza,inj,Quad PF,6+ Mos 02/11/2013   Influenza-Unspecified 03/08/2012, 03/08/2012, 02/11/2013, 01/24/2016, 02/06/2017   MMR 01/06/1995   PFIZER(Purple Top)SARS-COV-2 Vaccination 06/01/2019, 06/22/2019   PPD Test 10/18/2017, 10/18/2017, 05/13/2020   Pneumococcal Conjugate-13 05/31/2015   Pneumococcal Polysaccharide-23 07/27/2010   Td 05/18/2014, 05/18/2014   Tdap 10/21/2007   Typhoid  Inactivated 10/01/2013   Yellow Fever 10/01/2013    TDAP status: Up to date  Flu Vaccine status: Up to date  Pneumococcal vaccine status: Up to date  Covid-19 vaccine status: Completed vaccines  Qualifies for Shingles Vaccine? Yes   Zostavax completed No   Shingrix Completed?: No.    Education has been provided regarding the importance of this vaccine. Patient has been advised to call insurance company to determine out of pocket expense if they have not yet received this vaccine. Advised may also receive vaccine at local pharmacy or Health Dept. Verbalized acceptance and understanding.  Screening Tests Health Maintenance  Topic Date Due   Hepatitis C Screening  Never done   Zoster Vaccines- Shingrix (1 of 2) Never done   DEXA SCAN  Never done   COVID-19 Vaccine (3 - Pfizer risk series) 07/20/2019   INFLUENZA VACCINE  12/06/2022   Medicare Annual Wellness (AWV)  08/24/2023   DTaP/Tdap/Td (6 - Td or Tdap) 05/18/2024   Pneumonia Vaccine 53+ Years old  Completed   HPV VACCINES  Aged Out    Health Maintenance  Health Maintenance Due  Topic Date Due   Hepatitis C Screening  Never done   Zoster Vaccines- Shingrix (1 of 2) Never done   DEXA SCAN  Never done   COVID-19 Vaccine (3 - Pfizer risk series) 07/20/2019    Colorectal cancer screening: No longer required.   Mammogram status: No longer required due to age.  Lung Cancer Screening: (Low Dose CT Chest recommended if Age 70-80 years, 30 pack-year currently smoking OR have quit w/in 15years.) does not qualify.   Lung Cancer Screening Referral: no  Additional Screening:  Hepatitis C Screening: does not qualify; Completed yes  Vision Screening: Recommended annual ophthalmology exams for early detection of glaucoma and other disorders of the eye. Is the patient up to date with their annual eye exam?  Yes  Who is the provider or what is the name of the office in which the patient attends annual eye exams? Dr Brooke Dare If pt is  not established with a provider, would they like to be referred to a provider to establish care? No .   Dental Screening: Recommended annual dental exams for proper oral hygiene  Community Resource Referral / Chronic Care Management: CRR required this visit?  No   CCM required this visit?  No      Plan:     I have personally reviewed and noted the following in the patient's chart:   Medical and social history Use of alcohol, tobacco or illicit drugs  Current medications and supplements including opioid prescriptions. Patient is not currently taking opioid prescriptions. Functional ability and status Nutritional status Physical activity Advanced directives List of other physicians Hospitalizations, surgeries, and ER visits in previous 12 months Vitals Screenings to include cognitive, depression, and falls Referrals and appointments  In addition, I have reviewed and discussed with patient certain preventive protocols, quality metrics, and best practice recommendations. A written personalized care plan for preventive services as well as general preventive health recommendations were provided to patient.     Sue Lush, LPN   1/61/0960   Nurse Notes: The patient states she is doing well.   *Pt desires and inquires about needing orders for labwork :cholesterol, Vit D and blood test for TB.

## 2022-08-26 ENCOUNTER — Encounter: Payer: Self-pay | Admitting: Internal Medicine

## 2022-08-27 ENCOUNTER — Other Ambulatory Visit: Payer: Self-pay | Admitting: Internal Medicine

## 2022-08-27 DIAGNOSIS — E559 Vitamin D deficiency, unspecified: Secondary | ICD-10-CM

## 2022-08-27 DIAGNOSIS — Z111 Encounter for screening for respiratory tuberculosis: Secondary | ICD-10-CM

## 2022-08-27 DIAGNOSIS — E7801 Familial hypercholesterolemia: Secondary | ICD-10-CM

## 2022-08-30 ENCOUNTER — Encounter: Payer: Self-pay | Admitting: Internal Medicine

## 2022-08-31 LAB — QUANTIFERON-TB GOLD PLUS
QuantiFERON Mitogen Value: >10 IU/mL
QuantiFERON Nil Value: 0.02 IU/mL
QuantiFERON TB1 Ag Value: 0 IU/mL
QuantiFERON TB2 Ag Value: 0.01 IU/mL
QuantiFERON-TB Gold Plus: NEGATIVE

## 2022-08-31 LAB — LIPID PANEL
Chol/HDL Ratio: 2.3 ratio (ref 0.0–4.4)
Cholesterol, Total: 280 mg/dL — ABNORMAL HIGH (ref 100–199)
HDL: 122 mg/dL (ref >39)
LDL Chol Calc (NIH): 138 mg/dL — ABNORMAL HIGH (ref 0–99)
Triglycerides: 124 mg/dL (ref 0–149)
VLDL Cholesterol Cal: 20 mg/dL (ref 5–40)

## 2022-08-31 LAB — VITAMIN D 25 HYDROXY (VIT D DEFICIENCY, FRACTURES): Vit D, 25-Hydroxy: 38.7 ng/mL (ref 30.0–100.0)

## 2022-11-12 ENCOUNTER — Ambulatory Visit: Payer: Medicare Other | Admitting: Internal Medicine

## 2022-12-03 NOTE — Progress Notes (Unsigned)
Established Patient Office Visit  Subjective    Patient ID: Shawna Peterson, female    DOB: [birth year]  Age: 77 y.o. MRN: [id number]  CC:  No chief complaint on file.   HPI Shawna Peterson presents to follow up on chronic medical conditions.  HLD: -Medications: Nothing currently, had been on Crestor but had myalgias on Zetia ?? -Is following with cardiology, actually has an appointment tomorrow to discuss other medications -Appears to be familial -Last lipid panel: Lipid Panel     Component Value Date/Time   CHOL 280 (H) 08/28/2022 0811   TRIG 124 08/28/2022 0811   HDL 122 08/28/2022 0811   CHOLHDL 2.3 08/28/2022 0811   LDLCALC 138 (H) 08/28/2022 0811   LABVLDL 20 08/28/2022 0811   Meniere's Disease:  -Currently on Meclizine, Mazide 37.5-25 mg as needed - hasn't had to take in 2 years.  -Currently has hearing aids bilateral leg  Genital herpes: -Has a flare about 3-4 times a year -Last flare 4 months ago -Switched to Valtrex at LOV 1 g BID PRN  Environmental Allergies: -Currently on Allegra, Flonase, Atrovent as needed   GERD: -On Prilosec 20 mg, symptoms well controlled   Health Maintenance: -Blood work UTD -Mammogram, 10/22 Birads 1 - had mammograms every 2 years -Cologuard 07/04/20 negative, due in 1 year  -Prevnar 20 due, literature printed for the patient  Outpatient Encounter Medications as of 12/04/2022  Medication Sig   albuterol (VENTOLIN HFA) 108 (90 Base) MCG/ACT inhaler Inhale 2 puffs into the lungs every 4 (four) hours as needed.   ALPRAZolam (XANAX) 0.25 MG tablet Take 1 tablet by mouth daily as needed.   chlorpheniramine (CHLOR-TRIMETON) 4 MG tablet Take 1 tablet by mouth daily as needed.   Cholecalciferol 50 MCG (2000 UT) CAPS Take by mouth.   dicyclomine (BENTYL) 20 MG tablet Take 1 tablet (20 mg total) by mouth every 8 (eight) hours.   diphenhydrAMINE (SOMINEX) 25 MG tablet Take by mouth.   ezetimibe (ZETIA) 10 MG tablet Take 10 mg by mouth  daily.   fexofenadine (ALLEGRA) 180 MG tablet Take by mouth.   fluticasone (FLONASE) 50 MCG/ACT nasal spray SPRAY 2 SPRAYS INTO EACH NOSTRIL EVERY DAY   ipratropium (ATROVENT) 0.06 % nasal spray Place 2 sprays into both nostrils 4 (four) times daily.   loratadine (CLARITIN) 10 MG tablet Take by mouth.   meclizine (ANTIVERT) 25 MG tablet Take 25 mg by mouth 3 (three) times daily as needed for dizziness.   Multiple Vitamin (MULTIVITAMIN) tablet Take 1 tablet by mouth daily.   omeprazole (PRILOSEC) 20 MG capsule Take 1 capsule (20 mg total) by mouth daily.   Probiotic Product (DIGESTIVE ADVANTAGE) CAPS Take 1 capsule by mouth daily.   scopolamine (TRANSDERM-SCOP) 1 MG/3DAYS Place 1 patch (1.5 mg total) onto the skin every 3 (three) days.   traMADol (ULTRAM) 50 MG tablet Take 1 tablet by mouth every 6 (six) hours as needed. (Patient not taking: Reported on 02/15/2022)   triamterene-hydrochlorothiazide (MAXZIDE-25) 37.5-25 MG tablet Take 1 tablet by mouth daily as needed.   valACYclovir (VALTREX) 1000 MG tablet TAKE 1 GRAM TWICE A DAY AS NEEDED FOR FLARES.   Vitamin D, Ergocalciferol, (DRISDOL) 1.25 MG (50000 UNIT) CAPS capsule Take 1 capsule (50,000 Units total) by mouth every 7 (seven) days.   No facility-administered encounter medications on file as of 12/04/2022.    Past Medical History:  Diagnosis Date   Allergy 1964   Arthritis 2018   GERD (gastroesophageal reflux  disease) 2010   Hyperlipidemia 1980   Menetrier's disease    Situational anxiety 06/30/2015    Past Surgical History:  Procedure Laterality Date   CESAREAN SECTION  1971   EYE SURGERY  2011    Family History  Problem Relation Age of Onset   Breast cancer Maternal Aunt    Hyperlipidemia Father     Social History   Socioeconomic History   Marital status: Widowed    Spouse name: Not on file   Number of children: 1   Years of education: Not on file   Highest education level: Master's degree (e.g., MA, MS, MEng,  MEd, MSW, MBA)  Occupational History   Occupation: Retired  Tobacco Use   Smoking status: Never   Smokeless tobacco: Never   Tobacco comments:    Never smoked  Vaping Use   Vaping status: Never Used  Substance and Sexual Activity   Alcohol use: Yes    Alcohol/week: 2.0 standard drinks of alcohol    Types: 2 Glasses of wine per week    Comment: occasionally   Drug use: Never   Sexual activity: Not Currently    Birth control/protection: None  Other Topics Concern   Not on file  Social History Narrative   Not on file   Social Determinants of Health   Financial Resource Strain: Low Risk  (11/30/2022)   Overall Financial Resource Strain (CARDIA)    Difficulty of Paying Living Expenses: Not hard at all  Food Insecurity: No Food Insecurity (11/30/2022)   Hunger Vital Sign    Worried About Running Out of Food in the Last Year: Never true    Ran Out of Food in the Last Year: Never true  Transportation Needs: No Transportation Needs (11/30/2022)   PRAPARE - Administrator, Civil Service (Medical): No    Lack of Transportation (Non-Medical): No  Physical Activity: Sufficiently Active (11/30/2022)   Exercise Vital Sign    Days of Exercise per Week: 4 days    Minutes of Exercise per Session: 40 min  Stress: No Stress Concern Present (11/30/2022)   [employer] of Occupational Health - Occupational Stress Questionnaire    Feeling of Stress : Only a little  Social Connections: Unknown (11/30/2022)   Social Connection and Isolation Panel [NHANES]    Frequency of Communication with Friends and Family: Patient declined    Frequency of Social Gatherings with Friends and Family: Three times a week    Attends Religious Services: Patient declined    Active Member of Clubs or Organizations: No    Attends Banker Meetings: Never    Marital Status: Widowed  Intimate Partner Violence: Unknown (08/09/2021)   Received from [facility]   HITS    Physically Hurt: Not on  file    Insult or Talk Down To: Not on file    Threaten Physical Harm: Not on file    Scream or Curse: Not on file    Review of Systems  All other systems reviewed and are negative.       Objective    There were no vitals taken for this visit.  Physical Exam Constitutional:      Appearance: Normal appearance.  HENT:     Head: Normocephalic and atraumatic.     Right Ear: Tympanic membrane, ear canal and external ear normal.     Left Ear: Tympanic membrane, ear canal and external ear normal.     Mouth/Throat:     Mouth: Mucous membranes  are moist.     Pharynx: Oropharynx is clear.  Eyes:     Extraocular Movements: Extraocular movements intact.     Conjunctiva/sclera: Conjunctivae normal.     Pupils: Pupils are equal, round, and reactive to light.  Cardiovascular:     Rate and Rhythm: Normal rate and regular rhythm.  Pulmonary:     Effort: Pulmonary effort is normal.     Breath sounds: Normal breath sounds.  Musculoskeletal:     Cervical back: No tenderness.     Right lower leg: No edema.     Left lower leg: No edema.  Lymphadenopathy:     Cervical: No cervical adenopathy.  Skin:    General: Skin is warm and dry.  Neurological:     General: No focal deficit present.     Mental Status: She is alert. Mental status is at baseline.  Psychiatric:        Mood and Affect: Mood normal.        Behavior: Behavior normal.         Assessment & Plan:   1. Meniere's disease of both ears: Stable, has meclizine and Maxide she takes as needed for symptoms which she has not had to take in several years.  She does wear hearing aids bilaterally.  Check CBC and CMP today.  - CBC w/Diff/Platelet - COMPLETE METABOLIC PANEL WITH GFR  2. Genital herpes simplex, unspecified site: Will switch from acyclovir to valacyclovir 1 g twice daily as needed for flares, this was sent to her pharmacy.  Having flares about 3-4 times a year, is not on daily prophylaxis.  - valACYclovir  (VALTREX) 1000 MG tablet; Take 1 gram twice a day as needed for flares.  Dispense: 30 tablet; Refill: 3  3. Gastroesophageal reflux disease, unspecified whether esophagitis present: Chronic and stable.  Continue Prilosec 20 mg as needed.  4. Other fatigue: Check thyroid function today. - TSH  5. Vitamin D deficiency: Vitamin D levels checked today.  She is on over-the-counter supplements.  - Vitamin D (25 hydroxy)  6. Urinary urgency: No other acute urinary symptoms, no urinary incontinence.  Discussed regular Kegel exercises and if symptoms progress we will do a pelvic exam and potentially refer to pelvic floor physical therapy.   No follow-ups on file.   Margarita Mail, DO

## 2022-12-04 ENCOUNTER — Ambulatory Visit (INDEPENDENT_AMBULATORY_CARE_PROVIDER_SITE_OTHER): Payer: Medicare Other | Admitting: Internal Medicine

## 2022-12-04 ENCOUNTER — Encounter: Payer: Self-pay | Admitting: Internal Medicine

## 2022-12-04 ENCOUNTER — Other Ambulatory Visit: Payer: Self-pay | Admitting: Internal Medicine

## 2022-12-04 VITALS — BP 130/76 | HR 99 | Temp 97.9°F | Resp 16 | Ht 60.0 in | Wt 117.2 lb

## 2022-12-04 DIAGNOSIS — K219 Gastro-esophageal reflux disease without esophagitis: Secondary | ICD-10-CM

## 2022-12-04 DIAGNOSIS — I2510 Atherosclerotic heart disease of native coronary artery without angina pectoris: Secondary | ICD-10-CM

## 2022-12-04 DIAGNOSIS — E7801 Familial hypercholesterolemia: Secondary | ICD-10-CM

## 2022-12-04 DIAGNOSIS — Z1231 Encounter for screening mammogram for malignant neoplasm of breast: Secondary | ICD-10-CM

## 2022-12-04 DIAGNOSIS — Z780 Asymptomatic menopausal state: Secondary | ICD-10-CM

## 2022-12-04 DIAGNOSIS — Z789 Other specified health status: Secondary | ICD-10-CM

## 2022-12-04 MED ORDER — OMEPRAZOLE 20 MG PO CPDR: 20.0000 mg | DELAYED_RELEASE_CAPSULE | Freq: Every day | ORAL | 1 refills | Status: DC

## 2022-12-12 ENCOUNTER — Ambulatory Visit
Admission: RE | Admit: 2022-12-12 | Discharge: 2022-12-12 | Disposition: A | Discharge status: Home / Self Care | Payer: Medicare Other | Source: Ambulatory Visit | Attending: Internal Medicine | Admitting: Internal Medicine

## 2022-12-12 DIAGNOSIS — E7801 Familial hypercholesterolemia: Secondary | ICD-10-CM | POA: Insufficient documentation

## 2022-12-12 DIAGNOSIS — I2510 Atherosclerotic heart disease of native coronary artery without angina pectoris: Secondary | ICD-10-CM | POA: Insufficient documentation

## 2022-12-24 ENCOUNTER — Ambulatory Visit: Payer: Medicare Other | Admitting: Dermatology

## 2022-12-24 ENCOUNTER — Encounter: Payer: Self-pay | Admitting: Dermatology

## 2022-12-24 VITALS — BP 112/70

## 2022-12-24 DIAGNOSIS — D692 Other nonthrombocytopenic purpura: Secondary | ICD-10-CM

## 2022-12-24 DIAGNOSIS — D1801 Hemangioma of skin and subcutaneous tissue: Secondary | ICD-10-CM | POA: Diagnosis not present

## 2022-12-24 DIAGNOSIS — D229 Melanocytic nevi, unspecified: Secondary | ICD-10-CM

## 2022-12-24 DIAGNOSIS — L814 Other melanin hyperpigmentation: Secondary | ICD-10-CM | POA: Diagnosis not present

## 2022-12-24 DIAGNOSIS — L570 Actinic keratosis: Secondary | ICD-10-CM

## 2022-12-24 DIAGNOSIS — Z872 Personal history of diseases of the skin and subcutaneous tissue: Secondary | ICD-10-CM

## 2022-12-24 DIAGNOSIS — Z1283 Encounter for screening for malignant neoplasm of skin: Secondary | ICD-10-CM

## 2022-12-24 DIAGNOSIS — L821 Other seborrheic keratosis: Secondary | ICD-10-CM | POA: Diagnosis not present

## 2022-12-24 DIAGNOSIS — L820 Inflamed seborrheic keratosis: Secondary | ICD-10-CM | POA: Diagnosis not present

## 2022-12-24 DIAGNOSIS — L578 Other skin changes due to chronic exposure to nonionizing radiation: Secondary | ICD-10-CM

## 2022-12-24 DIAGNOSIS — W908XXA Exposure to other nonionizing radiation, initial encounter: Secondary | ICD-10-CM

## 2022-12-24 NOTE — Progress Notes (Signed)
Follow-Up Visit   Subjective  Shawna Peterson is a 77 y.o. female who presents for the following: Skin Cancer Screening and Full Body Skin Exam, hx of Aks, purpura arms, growths inframammary, back, >99yrs,  itchy  The patient presents for Total-Body Skin Exam (TBSE) for skin cancer screening and mole check. The patient has spots, moles and lesions to be evaluated, some may be new or changing and the patient may have concern these could be cancer.    The following portions of the chart were reviewed this encounter and updated as appropriate: medications, allergies, medical history  Review of Systems:  No other skin or systemic complaints except as noted in HPI or Assessment and Plan.  Objective  Well appearing patient in no apparent distress; mood and affect are within normal limits.  A full examination was performed including scalp, head, eyes, ears, nose, lips, neck, chest, axillae, abdomen, back, buttocks, bilateral upper extremities, bilateral lower extremities, hands, feet, fingers, toes, fingernails, and toenails. All findings within normal limits unless otherwise noted below.   Relevant physical exam findings are noted in the Assessment and Plan.  R forehead x 1 Pink scaly macules  R and L inframammary x >15, back x 1 (16) Stuck on waxy paps with erythema    Assessment & Plan   SKIN CANCER SCREENING PERFORMED TODAY.  ACTINIC DAMAGE - Chronic condition, secondary to cumulative UV/sun exposure - diffuse scaly erythematous macules with underlying dyspigmentation - Recommend daily broad spectrum sunscreen SPF 30+ to sun-exposed areas, reapply every 2 hours as needed.  - Staying in the shade or wearing long sleeves, sun glasses (UVA+UVB protection) and wide brim hats (4-inch brim around the entire circumference of the hat) are also recommended for sun protection.  - Call for new or changing lesions.  LENTIGINES, SEBORRHEIC KERATOSES, HEMANGIOMAS - Benign normal skin lesions -  Benign-appearing - Call for any changes  MELANOCYTIC NEVI - Tan-brown and/or pink-flesh-colored symmetric macules and papules - Benign appearing on exam today - Observation - Call clinic for new or changing moles - Recommend daily use of broad spectrum spf 30+ sunscreen to sun-exposed areas.   Solar Purpura - Chronic; persistent and recurrent.  Treatable, but not curable.(Lesion of concern) - Violaceous macules and patches - Benign - Related to trauma, age, sun damage and/or use of blood thinners, chronic use of topical and/or oral steroids - Observe - Can use OTC arnica containing moisturizer such as Dermend Bruise Formula if desired - Call for worsening or other concerns  - arms  AK (actinic keratosis) R forehead x 1  Actinic keratoses are precancerous spots that appear secondary to cumulative UV radiation exposure/sun exposure over time. They are chronic with expected duration over 1 year. A portion of actinic keratoses will progress to squamous cell carcinoma of the skin. It is not possible to reliably predict which spots will progress to skin cancer and so treatment is recommended to prevent development of skin cancer.  Recommend daily broad spectrum sunscreen SPF 30+ to sun-exposed areas, reapply every 2 hours as needed.  Recommend staying in the shade or wearing long sleeves, sun glasses (UVA+UVB protection) and wide brim hats (4-inch brim around the entire circumference of the hat). Call for new or changing lesions.  Destruction of lesion - R forehead x 1  Destruction method: cryotherapy   Informed consent: discussed and consent obtained   Lesion destroyed using liquid nitrogen: Yes   Region frozen until ice ball extended beyond lesion: Yes   Outcome:  patient tolerated procedure well with no complications   Post-procedure details: wound care instructions given   Additional details:  Prior to procedure, discussed risks of blister formation, small wound, skin  dyspigmentation, or rare scar following cryotherapy. Recommend Vaseline ointment to treated areas while healing.   Inflamed seborrheic keratosis (16) R and L inframammary x >15, back x 1  Symptomatic, irritating, patient would like treated.  Destruction of lesion - R and L inframammary x >15, back x 1 (16)  Destruction method: cryotherapy   Informed consent: discussed and consent obtained   Lesion destroyed using liquid nitrogen: Yes   Region frozen until ice ball extended beyond lesion: Yes   Outcome: patient tolerated procedure well with no complications   Post-procedure details: wound care instructions given   Additional details:  Prior to procedure, discussed risks of blister formation, small wound, skin dyspigmentation, or rare scar following cryotherapy. Recommend Vaseline ointment to treated areas while healing.    Return in about 1 year (around 12/24/2023) for TBSE, Hx of AKs.  I, Ardis Rowan, RMA, am acting as scribe for Elie Goody, MD .   Documentation: I have reviewed the above documentation for accuracy and completeness, and I agree with the above.  Elie Goody, MD

## 2022-12-24 NOTE — Patient Instructions (Addendum)
 Cryotherapy Aftercare  Wash gently with soap and water everyday.   Apply Vaseline and Band-Aid daily until healed.   Seborrheic Keratosis  What causes seborrheic keratoses? Seborrheic keratoses are harmless, common skin growths that first appear during adult life.  As time goes by, more growths appear.  Some people may develop a large number of them.  Seborrheic keratoses appear on both covered and uncovered body parts.  They are not caused by sunlight.  The tendency to develop seborrheic keratoses can be inherited.  They vary in color from skin-colored to gray, brown, or even black.  They can be either smooth or have a rough, warty surface.   Seborrheic keratoses are superficial and look as if they were stuck on the skin.  Under the microscope this type of keratosis looks like layers upon layers of skin.  That is why at times the top layer may seem to fall off, but the rest of the growth remains and re-grows.    Treatment Seborrheic keratoses do not need to be treated, but can easily be removed in the office.  Seborrheic keratoses often cause symptoms when they rub on clothing or jewelry.  Lesions can be in the way of shaving.  If they become inflamed, they can cause itching, soreness, or burning.  Removal of a seborrheic keratosis can be accomplished by freezing, burning, or surgery. If any spot bleeds, scabs, or grows rapidly, please return to have it checked, as these can be an indication of a skin cancer.     Due to recent changes in healthcare laws, you may see results of your pathology and/or laboratory studies on MyChart before the doctors have had a chance to review them. We understand that in some cases there may be results that are confusing or concerning to you. Please understand that not all results are received at the same time and often the doctors may need to interpret multiple results in order to provide you with the best plan of care or course of treatment. Therefore, we ask that  you please give [place] 2 business days to thoroughly review all your results before contacting the office for clarification. Should we see a critical lab result, you will be contacted sooner.   If You Need Anything After Your Visit  If you have any questions or concerns for your doctor, please call our main line at [phone] and press option 4 to reach your doctor's medical assistant. If no one answers, please leave a voicemail as directed and we will return your call as soon as possible. Messages left after 4 pm will be answered the following business day.   You may also send [place] a message via MyChart. We typically respond to MyChart messages within 1-2 business days.  For prescription refills, please ask your pharmacy to contact our office. Our fax number is [phone].  If you have an urgent issue when the clinic is closed that cannot wait until the next business day, you can page your doctor at the number below.    Please note that while we do our best to be available for urgent issues outside of office hours, we are not available 24/7.   If you have an urgent issue and are unable to reach [place], you may choose to seek medical care at your doctor's office, retail clinic, urgent care center, or emergency room.  If you have a medical emergency, please immediately call 911 or go to the emergency department.  Pager Numbers  - Dr. Gwen Pounds:  [phone]  - Dr. Roseanne Reno: [phone]  - Dr. Katrinka Blazing: [phone]   In the event of inclement weather, please call our main line at [phone] for an update on the status of any delays or closures.  Dermatology Medication Tips: Please keep the boxes that topical medications come in in order to help keep track of the instructions about where and how to use these. Pharmacies typically print the medication instructions only on the boxes and not directly on the medication tubes.   If your medication is too expensive, please contact our office at  [phone] option 4 or send [place] a message through MyChart.   We are unable to tell what your co-pay for medications will be in advance as this is different depending on your insurance coverage. However, we may be able to find a substitute medication at lower cost or fill out paperwork to get insurance to cover a needed medication.   If a prior authorization is required to get your medication covered by your insurance company, please allow [place] 1-2 business days to complete this process.  Drug prices often vary depending on where the prescription is filled and some pharmacies may offer cheaper prices.  The website www.goodrx.com contains coupons for medications through different pharmacies. The prices here do not account for what the cost may be with help from insurance (it may be cheaper with your insurance), but the website can give you the price if you did not use any insurance.  - You can print the associated coupon and take it with your prescription to the pharmacy.  - You may also stop by our office during regular business hours and pick up a GoodRx coupon card.  - If you need your prescription sent electronically to a different pharmacy, notify our office through [facility] or by phone at [phone] option 4.     Si Usted Necesita Algo Despus de Su Visita  Tambin puede enviarnos un mensaje a travs de Clinical cytogeneticist. Por lo general respondemos a los mensajes de MyChart en el transcurso de 1 a 2 das hbiles.  Para renovar recetas, por favor pida a su farmacia que se ponga en contacto con nuestra oficina. Annie Sable de fax es [place] [phone].  Si tiene un asunto urgente cuando la clnica est cerrada y que no puede esperar hasta el siguiente da hbil, puede llamar/localizar a su doctor(a) al nmero que aparece a continuacin.   Por favor, tenga en cuenta que aunque hacemos todo lo posible para estar disponibles para asuntos urgentes fuera del horario de [place], no estamos  disponibles las 24 horas del da, los 7 [address]  [address] de la [place].   Si tiene un problema urgente y no puede comunicarse con nosotros, puede optar por buscar atencin mdica  en el consultorio de su doctor(a), en una clnica privada, en un centro de atencin urgente o en una sala de emergencias.  Si tiene Engineer, drilling, por favor llame inmediatamente al 911 o vaya a la sala de emergencias.  Nmeros de bper  - Dr. Gwen Pounds: [phone]  - Dra. Roseanne Reno: [phone]  - Dr. Katrinka Blazing: [phone]   En caso de inclemencias del tiempo, por favor llame a Lacy Duverney principal al [phone] para una actualizacin sobre el [place] de cualquier retraso o cierre.  Consejos para la medicacin en dermatologa: Por favor, guarde las cajas en las que vienen los medicamentos de uso tpico para ayudarle a seguir las instrucciones sobre dnde y cmo usarlos. Las farmacias generalmente imprimen las instrucciones del medicamento  slo en las cajas y no directamente en los tubos del medicamento.   Si su medicamento es muy caro, por favor, pngase en contacto con Rolm Gala llamando al [phone] y presione la opcin 4 o envenos un mensaje a travs de Clinical cytogeneticist.   No podemos decirle cul ser su copago por los medicamentos por adelantado ya que esto es diferente dependiendo de la cobertura de su seguro. Sin embargo, es posible que podamos encontrar un medicamento sustituto a Audiological scientist un formulario para que el seguro cubra el medicamento que se considera necesario.   Si se requiere una autorizacin previa para que su compaa de seguros [place] su medicamento, por favor permtanos de 1 a 2 das hbiles para completar [address]  Los precios de los medicamentos varan con frecuencia dependiendo del Environmental consultant de dnde se surte la receta y alguna farmacias pueden ofrecer precios ms baratos.  El sitio web www.goodrx.com tiene cupones para medicamentos de Health and safety inspector. Los precios aqu no  tienen en cuenta lo que podra costar con la ayuda del seguro (puede ser ms barato con su seguro), pero el sitio web puede darle el precio si no utiliz Tourist information centre manager.  - Puede imprimir el cupn correspondiente y llevarlo con su receta a la farmacia.  - Tambin puede pasar por nuestra oficina durante el horario de atencin regular y Education officer, museum una tarjeta de cupones de GoodRx.  - Si necesita que su receta se enve electrnicamente a una farmacia diferente, informe a nuestra oficina a travs de MyChart de [place] o por telfono llamando al [phone] y presione la opcin 4.

## 2023-01-01 ENCOUNTER — Ambulatory Visit: Payer: Medicare Other

## 2023-01-01 ENCOUNTER — Ambulatory Visit
Admission: RE | Admit: 2023-01-01 | Discharge: 2023-01-01 | Disposition: A | Discharge status: Home / Self Care | Payer: Medicare Other | Source: Ambulatory Visit | Attending: Internal Medicine | Admitting: Internal Medicine

## 2023-01-01 DIAGNOSIS — Z780 Asymptomatic menopausal state: Secondary | ICD-10-CM | POA: Diagnosis present

## 2023-01-02 ENCOUNTER — Encounter: Payer: Self-pay | Admitting: Internal Medicine

## 2023-01-03 ENCOUNTER — Other Ambulatory Visit: Payer: Self-pay | Admitting: Internal Medicine

## 2023-01-03 DIAGNOSIS — E7801 Familial hypercholesterolemia: Secondary | ICD-10-CM

## 2023-01-03 DIAGNOSIS — I2510 Atherosclerotic heart disease of native coronary artery without angina pectoris: Secondary | ICD-10-CM

## 2023-01-15 ENCOUNTER — Ambulatory Visit: Payer: Medicare Other | Admitting: Internal Medicine

## 2023-02-07 ENCOUNTER — Ambulatory Visit: Payer: Medicare Other | Admitting: Internal Medicine

## 2023-02-10 NOTE — Progress Notes (Unsigned)
Established Patient Office Visit  Subjective    Patient ID: Shawna Peterson, female    DOB: 1946/02/21  Age: 77 y.o. MRN: [id number]  CC:  No chief complaint on file.   HPI Shawna Peterson presents to follow up on chronic medical conditions.  HLD: -Medications: Nothing currently, had been on Crestor but had myalgias in the past. Also had been on Zetia at the time of her last labs. -Is following with cardiology, had a complete cardiac work up 2 years ago that was negative  -Appears to be familial -Last lipid panel: Lipid Panel     Component Value Date/Time   CHOL 280 (H) 08/28/2022 0811   TRIG 124 08/28/2022 0811   HDL 122 08/28/2022 0811   CHOLHDL 2.3 08/28/2022 0811   LDLCALC 138 (H) 08/28/2022 0811   LABVLDL 20 08/28/2022 0811   Meniere's Disease:  -Currently on Meclizine, Mazide 37.5-25 mg as needed - hasn't had to take in 2 years.  -Currently has hearing aids bilateral leg  Genital herpes: -Has a flare about 3-4 times a year -Last flare 4 months ago -Switched to Valtrex at LOV 1 g BID PRN, only had to take once and did well   Environmental Allergies: -Currently on Allegra, Singulair, Ryaltris - seen by allergist    GERD: -On Prilosec 20 mg, symptoms well controlled   Health Maintenance: -Blood work UTD -Mammogram, 10/22 Bir[place] 1 - had mammograms every 2 years -Cologuard 07/04/20 negative, due in 1 year  -Prevnar 20 due, literature printed for the patient  Outpatient Encounter Medications as of 02/11/2023  Medication Sig   albuterol (VENTOLIN HFA) 108 (90 Base) MCG/ACT inhaler Inhale 2 puffs into the lungs every 4 (four) hours as needed.   chlorpheniramine (CHLOR-TRIMETON) 4 MG tablet Take 1 tablet by mouth daily as needed.   Cholecalciferol 50 MCG (2000 UT) CAPS Take by mouth.   diphenhydrAMINE (SOMINEX) 25 MG tablet Take by mouth.   fexofenadine (ALLEGRA) 180 MG tablet Take by mouth.   fluticasone (FLONASE) 50 MCG/ACT nasal spray SPRAY 2 SPRAYS INTO EACH  NOSTRIL EVERY DAY   ipratropium (ATROVENT) 0.06 % nasal spray Place 2 sprays into both nostrils 4 (four) times daily.   loratadine (CLARITIN) 10 MG tablet Take by mouth.   meclizine (ANTIVERT) 25 MG tablet Take 25 mg by mouth 3 (three) times daily as needed for dizziness.   montelukast (SINGULAIR) 10 MG tablet Take 10 mg by mouth at bedtime.   Multiple Vitamin (MULTIVITAMIN) tablet Take 1 tablet by mouth daily.   omeprazole (PRILOSEC) 20 MG capsule Take 1 capsule (20 mg total) by mouth daily.   Probiotic Product (DIGESTIVE ADVANTAGE) CAPS Take 1 capsule by mouth daily.   RYALTRIS [id number] MCG/ACT SUSP Place 2 sprays into both nostrils 2 (two) times daily.   triamterene-hydrochlorothiazide (MAXZIDE-25) 37.5-25 MG tablet Take 1 tablet by mouth daily as needed.   valACYclovir (VALTREX) 1000 MG tablet TAKE 1 GRAM TWICE A DAY AS NEEDED FOR FLARES.   Vitamin D, Ergocalciferol, (DRISDOL) 1.25 MG (50000 UNIT) CAPS capsule Take 1 capsule (50,000 Units total) by mouth every 7 (seven) days.   No facility-administered encounter medications on file as of 02/11/2023.    Past Medical History:  Diagnosis Date   Actinic keratosis    Allergy 1964   Arthritis 2018   GERD (gastroesophageal reflux disease) 2010   Hyperlipidemia 1980   Menetrier's disease    Situational anxiety 06/30/2015    Past Surgical History:  Procedure Laterality Date  CESAREAN SECTION  1971   EYE SURGERY  2011    Family History  Problem Relation Age of Onset   Breast cancer Maternal Aunt    Hyperlipidemia Father     Social History   Socioeconomic History   Marital status: Widowed    Spouse name: Not on file   Number of children: 1   Years of education: Not on file   Highest education level: Master's degree (e.g., MA, MS, MEng, MEd, MSW, MBA)  Occupational History   Occupation: Retired  Tobacco Use   Smoking status: Never   Smokeless tobacco: Never   Tobacco comments:    Never smoked  Vaping Use   Vaping status:  Never Used  Substance and Sexual Activity   Alcohol use: Yes    Alcohol/week: 2.0 standard drinks of alcohol    Types: 2 Glasses of wine per week    Comment: occasionally   Drug use: Never   Sexual activity: Not Currently    Birth control/protection: None  Other Topics Concern   Not on file  Social History Narrative   Not on file   Social Determinants of Health   Financial Resource Strain: Low Risk  (11/30/2022)   Overall Financial Resource Strain (CARDIA)    Difficulty of Paying Living Expenses: Not hard at all  Food Insecurity: No Food Insecurity (11/30/2022)   Hunger Vital Sign    Worried About Running Out of Food in the Last Year: Never true    Ran Out of Food in the Last Year: Never true  Transportation Needs: No Transportation Needs (11/30/2022)   PRAPARE - Administrator, Civil Service (Medical): No    Lack of Transportation (Non-Medical): No  Physical Activity: Sufficiently Active (11/30/2022)   Exercise Vital Sign    Days of Exercise per Week: 4 days    Minutes of Exercise per Session: 40 min  Stress: No Stress Concern Present (11/30/2022)   [employer] of Occupational Health - Occupational Stress Questionnaire    Feeling of Stress : Only a little  Social Connections: Unknown (11/30/2022)   Social Connection and Isolation Panel [NHANES]    Frequency of Communication with Friends and Family: Patient declined    Frequency of Social Gatherings with Friends and Family: Three times a week    Attends Religious Services: Patient declined    Active Member of Clubs or Organizations: No    Attends Banker Meetings: Never    Marital Status: Widowed  Intimate Partner Violence: Unknown (08/09/2021)   [facility] from [facility]   HITS    Physically Hurt: Not on file    Insult or Talk Down To: Not on file    Threaten Physical Harm: Not on file    Scream or Curse: Not on file    Review of Systems  All other systems reviewed and are  negative.       Objective    There were no vitals taken for this visit.  Physical Exam Constitutional:      Appearance: Normal appearance.  HENT:     Head: Normocephalic and atraumatic.     Left Ear: External ear normal.  Eyes:     Conjunctiva/sclera: Conjunctivae normal.  Cardiovascular:     Rate and Rhythm: Normal rate and regular rhythm.  Pulmonary:     Effort: Pulmonary effort is normal.     Breath sounds: Normal breath sounds.  Musculoskeletal:     Right lower leg: No edema.  Left lower leg: No edema.  Skin:    General: Skin is warm and dry.     Comments: Few Sk's on back  Neurological:     General: No focal deficit present.     Mental Status: She is alert. Mental status is at baseline.  Psychiatric:        Mood and Affect: Mood normal.        Behavior: Behavior normal.         Assessment & Plan:   1. 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 Unable to calculate ASCVD risk due to high HDL levels. Patient had a normal cardiac calcium scoring 2 years ago, very insistent on having this test repeated now. Discussed this may not be covered by insurance due to age but will try.   - CT MODIFIED CAL SCORE; Future  2. Gastroesophageal reflux disease without esophagitis: Stable, refill Prilosec.   - omeprazole (PRILOSEC) 20 MG capsule; Take 1 capsule (20 mg total) by mouth daily.  Dispense: 90 capsule; Refill: 1  3. Postmenopausal estrogen deficiency/Encounter for screening mammogram for malignant neoplasm of breast: Mammogram and bone scan ordered.   - DG Bone Density; Future - MM 3D SCREENING MAMMOGRAM BILATERAL BREAST; Future   No follow-ups on file.   Margarita Mail, DO

## 2023-02-11 ENCOUNTER — Encounter: Payer: Self-pay | Admitting: Internal Medicine

## 2023-02-11 ENCOUNTER — Ambulatory Visit (INDEPENDENT_AMBULATORY_CARE_PROVIDER_SITE_OTHER): Payer: Medicare Other | Admitting: Internal Medicine

## 2023-02-11 VITALS — BP 136/62 | HR 101 | Temp 97.5°F | Resp 16 | Ht 60.0 in | Wt 116.1 lb

## 2023-02-11 DIAGNOSIS — H6122 Impacted cerumen, left ear: Secondary | ICD-10-CM | POA: Diagnosis not present

## 2023-02-11 DIAGNOSIS — M816 Localized osteoporosis [Lequesne]: Secondary | ICD-10-CM | POA: Diagnosis not present

## 2023-02-19 ENCOUNTER — Other Ambulatory Visit: Payer: Self-pay | Admitting: Gastroenterology

## 2023-02-21 ENCOUNTER — Telehealth: Payer: Self-pay

## 2023-02-21 MED ORDER — DICYCLOMINE HCL 20 MG PO TABS: 20.0000 mg | ORAL_TABLET | Freq: Three times a day (TID) | ORAL | 0 refills | Status: DC

## 2023-02-21 NOTE — Addendum Note (Signed)
Addended by: Radene Knee L on: 02/21/2023 02:27 PM   Modules accepted: Orders

## 2023-02-21 NOTE — Telephone Encounter (Signed)
Sent medication pharmacy

## 2023-02-21 NOTE — Telephone Encounter (Signed)
Pt has appt.on 04-16-23 .Pt needs refill on Dicyclomine 20 mg.

## 2023-02-22 ENCOUNTER — Encounter: Payer: Self-pay | Admitting: Internal Medicine

## 2023-02-25 LAB — TESTOSTERONE,FREE AND TOTAL
Testosterone, Free: 2.3 pg/mL (ref 0.0–4.2)
Testosterone: 137 ng/dL — ABNORMAL HIGH (ref 3–67)

## 2023-02-25 LAB — VITAMIN D 25 HYDROXY (VIT D DEFICIENCY, FRACTURES): Vit D, 25-Hydroxy: 52.9 ng/mL (ref 30.0–100.0)

## 2023-02-25 LAB — 17-HYDROXYPROGESTERONE: 17-OH Progesterone LCMS: 30 ng/dL

## 2023-02-25 LAB — FOLLICLE STIMULATING HORMONE: FSH: 37.4 m[IU]/mL (ref 25.8–134.8)

## 2023-02-25 LAB — ESTRADIOL: Estradiol: 21.5 pg/mL (ref 0.0–54.7)

## 2023-03-11 ENCOUNTER — Ambulatory Visit: Payer: Medicare Other | Admitting: Cardiovascular Disease

## 2023-03-12 ENCOUNTER — Encounter: Payer: Self-pay | Admitting: Gastroenterology

## 2023-03-12 ENCOUNTER — Ambulatory Visit (INDEPENDENT_AMBULATORY_CARE_PROVIDER_SITE_OTHER): Payer: Medicare Other | Admitting: Gastroenterology

## 2023-03-12 VITALS — BP 127/63 | HR 92 | Temp 97.7°F | Ht 60.0 in | Wt 115.2 lb

## 2023-03-12 DIAGNOSIS — K219 Gastro-esophageal reflux disease without esophagitis: Secondary | ICD-10-CM

## 2023-03-12 NOTE — Progress Notes (Signed)
Arlyss Repress, MD [address]  [address]  [place], [place] [address]  Main: [phone]  Fax: [phone]    Gastroenterology Consultation  Referring Provider:     Margarita Mail, DO Primary Care Physician:  Margarita Mail, DO Primary Gastroenterologist:  Dr. Arlyss Repress Reason for Consultation: Chronic GERD, abdominal pain        HPI:   Shawna Peterson is a 76 y.o. female referred by Dr. Margarita Mail, DO  for consultation & management of chronic GERD and abdominal pain.  Patient went to the ER in January 2022 secondary to upper abdominal pain.  She underwent CT abdomen pelvis which was unremarkable for acute intra-abdominal pathology being seen on the diabetes would normalize.  She received Bentyl 20 mg that she took it for 3 days only which provided relief.  Pain did not recur since then.  Patient does have a history of reflux for which she takes omeprazole 20 mg daily for a long time.  She reports undergoing upper endoscopy more than 10 years ago.  She also underwent colonoscopy more than 10 years ago she did not like the experience offered.  Patient is very active, independent, lives alone, exercises daily and eats healthy.  Patient denies any GI symptoms today  Follow-up visit 03/22/2023 Shawna Peterson is here for follow-up of chronic GERD and requesting refill for Bentyl.  She takes Bentyl as needed for abdominal cramps which helps.  She does not have any concerns today.  Her reflux is under control on omeprazole 20 mg daily, receives refill from her PCP.  NSAIDs: None  Antiplts/Anticoagulants/Anti thrombotics: None  GI Procedures: Reports undergoing EGD and colonoscopy more than 10 years ago, reportedly normal  Past Medical History:  Diagnosis Date   Actinic keratosis    Allergy 1964   Arthritis 2018   GERD (gastroesophageal reflux disease) 2010   Hyperlipidemia 1980   Menetrier's disease    Situational anxiety 06/30/2015    Past Surgical  History:  Procedure Laterality Date   CESAREAN SECTION  1971   EYE SURGERY  2011     Current Outpatient Medications:    albuterol (VENTOLIN HFA) 108 (90 Base) MCG/ACT inhaler, Inhale 2 puffs into the lungs every 4 (four) hours as needed., Disp: 1 each, Rfl: 1   chlorpheniramine (CHLOR-TRIMETON) 4 MG tablet, Take 1 tablet by mouth daily as needed., Disp: , Rfl:    Cholecalciferol 50 MCG (2000 UT) CAPS, Take by mouth., Disp: , Rfl:    dicyclomine (BENTYL) 20 MG tablet, Take 1 tablet (20 mg total) by mouth every 8 (eight) hours., Disp: 20 tablet, Rfl: 0   diphenhydrAMINE (SOMINEX) 25 MG tablet, Take by mouth., Disp: , Rfl:    fexofenadine (ALLEGRA) 180 MG tablet, Take by mouth., Disp: , Rfl:    fluticasone (FLONASE) 50 MCG/ACT nasal spray, SPRAY 2 SPRAYS INTO EACH NOSTRIL EVERY DAY, Disp: , Rfl:    ipratropium (ATROVENT) 0.06 % nasal spray, Place 2 sprays into both nostrils 4 (four) times daily., Disp: 15 mL, Rfl: 12   loratadine (CLARITIN) 10 MG tablet, Take by mouth., Disp: , Rfl:    meclizine (ANTIVERT) 25 MG tablet, Take 25 mg by mouth 3 (three) times daily as needed for dizziness., Disp: , Rfl:    montelukast (SINGULAIR) 10 MG tablet, Take 10 mg by mouth at bedtime., Disp: , Rfl:    Multiple Vitamin (MULTIVITAMIN) tablet, Take 1 tablet by mouth daily., Disp: , Rfl:    omeprazole (PRILOSEC) 20  MG capsule, Take 1 capsule (20 mg total) by mouth daily., Disp: 90 capsule, Rfl: 1   Probiotic Product (DIGESTIVE ADVANTAGE) CAPS, Take 1 capsule by mouth daily., Disp: , Rfl:    progesterone (PROMETRIUM) 100 MG capsule, Take 100-200 mg by mouth at bedtime., Disp: , Rfl:    RYALTRIS 665-25 MCG/ACT SUSP, Place 2 sprays into both nostrils 2 (two) times daily., Disp: , Rfl:    triamterene-hydrochlorothiazide (MAXZIDE-25) 37.5-25 MG tablet, Take 1 tablet by mouth daily as needed., Disp: , Rfl:    valACYclovir (VALTREX) 1000 MG tablet, TAKE 1 GRAM TWICE A DAY AS NEEDED FOR FLARES., Disp: 180 tablet, Rfl:  0   Family History  Problem Relation Age of Onset   Breast cancer Maternal Aunt    Hyperlipidemia Father      Social History   Tobacco Use   Smoking status: Never   Smokeless tobacco: Never   Tobacco comments:    Never smoked  Vaping Use   Vaping status: Never Used  Substance Use Topics   Alcohol use: Yes    Alcohol/week: 2.0 standard drinks of alcohol    Types: 2 Glasses of wine per week    Comment: occasionally   Drug use: Never    Allergies as of 03/12/2023 - Review Complete 03/12/2023  Allergen Reaction Noted   Lorazepam Nausea Only and Other (See Comments) 03/17/2019   Atorvastatin Other (See Comments) 05/15/2022   Rosuvastatin Other (See Comments) 05/15/2022   Statins Other (See Comments) 12/04/2022    Review of Systems:    All systems reviewed and negative except where noted in HPI.   Physical Exam:  BP 127/63 (BP Location: Left Arm, Patient Position: Sitting, Cuff Size: Normal)   Pulse 92   Temp 97.7 F (36.5 C) (Oral)   Ht 5' (1.524 m)   Wt 115 lb 4 oz (52.3 kg)   BMI 22.51 kg/m  No LMP recorded. Patient is postmenopausal.  General:   Alert,  Well-developed, well-nourished, pleasant and cooperative in NAD Head:  Normocephalic and atraumatic. Eyes:  Sclera clear, no icterus.   Conjunctiva pink. Ears:  Normal auditory acuity. Nose:  No deformity, discharge, or lesions. Mouth:  No deformity or lesions,oropharynx pink & moist. Neck:  Supple; no masses or thyromegaly. Lungs:  Respirations even and unlabored.  Clear throughout to auscultation.   No wheezes, crackles, or rhonchi. No acute distress. Heart:  Regular rate and rhythm; no murmurs, clicks, rubs, or gallops. Abdomen:  Normal bowel sounds. Soft, non-tender and non-distended without masses, hepatosplenomegaly or hernias noted.  No guarding or rebound tenderness.   Rectal: Not performed Msk:  Symmetrical without gross deformities. Good, equal movement & strength bilaterally. Pulses:  Normal pulses  noted. Extremities:  No clubbing or edema.  No cyanosis. Neurologic:  Alert and oriented x3;  grossly normal neurologically. Skin:  Intact without significant lesions or rashes. No jaundice. Psych:  Alert and cooperative. Normal mood and affect.  Imaging Studies: Reviewed  Assessment and Plan:   Shawna Peterson is a 77 y.o. pleasant Caucasian female with no significant past medical history, chronic GERD on low-dose omeprazole.  Self-limited episode of upper abdominal pain which has resolved after taking Bentyl.  Patient is requesting a short course of Bentyl for future episodes, prescription sent.  Chronic GERD on low-dose omeprazole Recommend EGD and patient said she will think about it  Colon cancer screening Discussed with patient regarding colonoscopy and she said she will think about it Discussed about risks and benefits of  the procedure, various bowel prep formulations, anesthesia I have also discussed with patient regarding screening colonoscopy given that she is overall healthy and very independent.  Patient said she will think about the GI procedures and will call our office back if she decides to proceed I have also advised her to undergo upper endoscopy given her history of chronic GERD along with colonoscopy  Follow up as needed   Arlyss Repress, MD

## 2023-03-19 NOTE — Progress Notes (Signed)
  Cardiology Office Note:  .   Date:  03/22/2023  ID:  Shawna Peterson, DOB March 30, 1946, MRN [id number] PCP: Margarita Mail, DO  [facility] Providers Cardiologist:  Reatha Harps, MD { History of Present Illness: Shawna Peterson is a 77 y.o. female with history of HLD, CAC who presents for the evaluation of HLD at the request of Margarita Mail, DO.   History of Present Illness   Shawna Peterson, a 77 year old female with a history of hyperlipidemia and coronary calcium, presents for evaluation of her hyperlipidemia. She has a familial history of high cholesterol, which has been a lifelong issue for her and her son. Despite attempts to manage her cholesterol, she reports that her levels remain high and she experiences side effects from statins. She has not had a heart attack or stroke, does not have high blood pressure or diabetes, and is still actively working as a Comptroller. Her father had a stroke and both of his parents died of heart attacks. She does not smoke or use drugs, and only occasionally drinks wine. She has had a C-section and eye surgery in the past. Her most recent LDL cholesterol level was 409 and her coronary calcium score was 24.9, which is in the 30th percentile. She has tried Zetia in the past, but it did not significantly lower her cholesterol levels and caused leg discomfort. She cannot take statins.          Problem List HLD/FH -T chol 336, HDL 113, LDL 200, TG 134 2. Coronary calcium -24.9 (33rd percentile)     ROS: All other ROS reviewed and negative. Pertinent positives noted in the HPI.     Studies Reviewed: Marland Kitchen   EKG Interpretation Date/Time:  Friday March 22 2023 13:58:16 EST Ventricular Rate:  90 PR Interval:  180 QRS Duration:  72 QT Interval:  334 QTC Calculation: 408 R Axis:   -26  Text Interpretation: Normal sinus rhythm Low voltage QRS Confirmed by Shawna Peterson [phone]) on 03/22/2023 2:14:29 PM   Physical Exam:   VS:  BP  134/74 (BP Location: Left Arm, Patient Position: Sitting, Cuff Size: Normal)   Pulse 90   Ht 5' (1.524 m)   Wt 114 lb 12.8 oz (52.1 kg)   SpO2 97%   BMI 22.42 kg/m    Wt Readings from Last 3 Encounters:  03/22/23 114 lb 12.8 oz (52.1 kg)  03/12/23 115 lb 4 oz (52.3 kg)  02/11/23 116 lb 1.6 oz (52.7 kg)    GEN: Well nourished, well developed in no acute distress NECK: No JVD; No carotid bruits CARDIAC: RRR, no murmurs, rubs, gallops RESPIRATORY:  Clear to auscultation without rales, wheezing or rhonchi  ABDOMEN: Soft, non-tender, non-distended EXTREMITIES:  No edema; No deformity  ASSESSMENT AND PLAN: .   Assessment and Plan    Hyperlipidemia Familial hypercholesterolemia suspected due to persistently high LDL despite interventions. Statin and Zetia intolerance reported. Coronary calcium score in the 33rd percentile. No symptoms of angina. EKG normal. -Check LPA today to assess pathogenicity. -Refer to pharmacy for discussion of PCSK9 inhibitors (Repatha) or Leqvio as potential treatment options. -Follow-up in 6 months to reassess.              Follow-up: Return in about 6 months (around 09/19/2023).   Signed, Lenna Gilford. Flora Lipps, MD, [facility]  [facility]  [address] [place], [place] [address] [phone]  2:38 PM

## 2023-03-22 ENCOUNTER — Encounter: Payer: Self-pay | Admitting: Cardiovascular Disease

## 2023-03-22 ENCOUNTER — Ambulatory Visit: Payer: Medicare Other | Attending: Cardiovascular Disease | Admitting: Cardiovascular Disease

## 2023-03-22 VITALS — BP 134/74 | HR 90 | Ht 60.0 in | Wt 114.8 lb

## 2023-03-22 DIAGNOSIS — E782 Mixed hyperlipidemia: Secondary | ICD-10-CM | POA: Insufficient documentation

## 2023-03-22 DIAGNOSIS — R931 Abnormal findings on diagnostic imaging of heart and coronary circulation: Secondary | ICD-10-CM | POA: Diagnosis not present

## 2023-03-22 NOTE — Patient Instructions (Addendum)
Medication Instructions:  NO CHANGES    *If you need a refill on your cardiac medications before your next appointment, please call your pharmacy*   Lab Work:  Lpa   If you have labs (blood work) drawn today and your tests are completely normal, you will receive your results only by: MyChart Message (if you have MyChart) OR A paper copy in the mail If you have any lab test that is abnormal or we need to change your treatment, we will call you to review the results.   Testing/Procedures: None    Follow-Up: At [facility], you and your health needs are our priority.  As part of our continuing mission to provide you with exceptional heart care, we have created designated Provider Care Teams.  These Care Teams include your primary Cardiologist (physician) and Advanced Practice Providers (APPs -  Physician Assistants and Nurse Practitioners) who all work together to provide you with the care you need, when you need it.  We recommend signing up for the patient portal called "MyChart".  Sign up information is provided on this After Visit Summary.  MyChart is used to connect with patients for Virtual Visits (Telemedicine).  Patients are able to view lab/test results, encounter notes, upcoming appointments, etc.  Non-urgent messages can be sent to your provider as well.   To learn more about what you can do with MyChart, go to [email or web address].    Your next appointment:   6 month(s)  The format for your next appointment:   In Person  Provider:   Reatha Harps, MD    Other Instructions DR. ONEAL has referred you to our lipid clinic to discuss medication options for your cholesterol

## 2023-03-24 LAB — LIPOPROTEIN A (LPA): Lipoprotein (a): <8.4 nmol/L (ref <75.0)

## 2023-03-28 ENCOUNTER — Telehealth: Payer: Self-pay | Admitting: Cardiovascular Disease

## 2023-03-28 NOTE — Telephone Encounter (Signed)
Pt states she got a phone call from the pharmacy and is returning their call. Please advise

## 2023-03-28 NOTE — Telephone Encounter (Signed)
Spoke to patient is returning a call from the pharmacist, Patient states she has been communicating with Belenda Cruise in the pharmacy about a medication she is suppose to be starting in the infusion center. Please advise.

## 2023-03-29 ENCOUNTER — Encounter: Payer: Self-pay | Admitting: Cardiovascular Disease

## 2023-04-02 ENCOUNTER — Telehealth: Payer: Self-pay | Admitting: Pharmacy Technician

## 2023-04-02 ENCOUNTER — Other Ambulatory Visit: Payer: Self-pay | Admitting: Pharmacist Clinician (PhC)/ Clinical Pharmacy Specialist

## 2023-04-02 DIAGNOSIS — E785 Hyperlipidemia, unspecified: Secondary | ICD-10-CM | POA: Insufficient documentation

## 2023-04-02 NOTE — Progress Notes (Signed)
Leqvio order placed

## 2023-04-02 NOTE — Telephone Encounter (Signed)
Spoke with patient,  she has $545 deductible and then pays 25% of the cost.  Praluent preferred and 25% is ~$350/month.  Pt will call to schedule Leqvio should she decide to move forward.

## 2023-04-02 NOTE — Telephone Encounter (Signed)
Shawna Peterson note:  patient will be scheduled as soon as possible.  Auth Submission: NO AUTH NEEDED Site of care: Site of care: [facility] Payer: medicare a/b & atena supp Medication & CPT/J Code(s) submitted: Leqvio (Inclisiran) J1306 Route of submission (phone, fax, portal):  Phone # Fax # Auth type: Buy/Bill HB Units/visits requested: 1 dose Reference number:  Approval from: 04/02/23 to 05/07/23

## 2023-04-05 ENCOUNTER — Encounter: Payer: Self-pay | Admitting: Cardiovascular Disease

## 2023-04-08 MED ORDER — PRALUENT 150 MG/ML ~~LOC~~ SOAJ
150.0000 mg | SUBCUTANEOUS | 3 refills | Status: DC
Start: 2023-04-08 — End: 2024-02-25

## 2023-04-09 IMAGING — MG MM DIGITAL SCREENING BILAT W/ TOMO AND CAD
8 series · 8 of 24 positions shown · non-contrast
Comparison: Previous exam(s).

CLINICAL DATA: Screening.

EXAM:
DIGITAL SCREENING BILATERAL MAMMOGRAM WITH TOMOSYNTHESIS AND CAD
TECHNIQUE: Bilateral screening digital craniocaudal and mediolateral oblique
mammograms were obtained. Bilateral screening digital breast
tomosynthesis was performed. The images were evaluated with
computer-aided detection.

[R CC synth-2D]
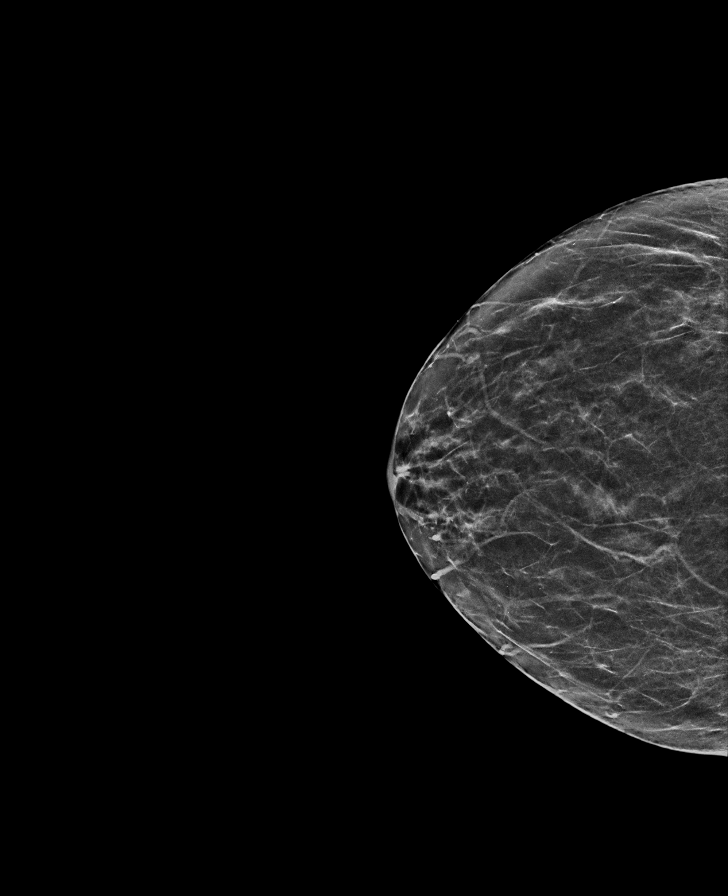

[R MLO synth-2D]
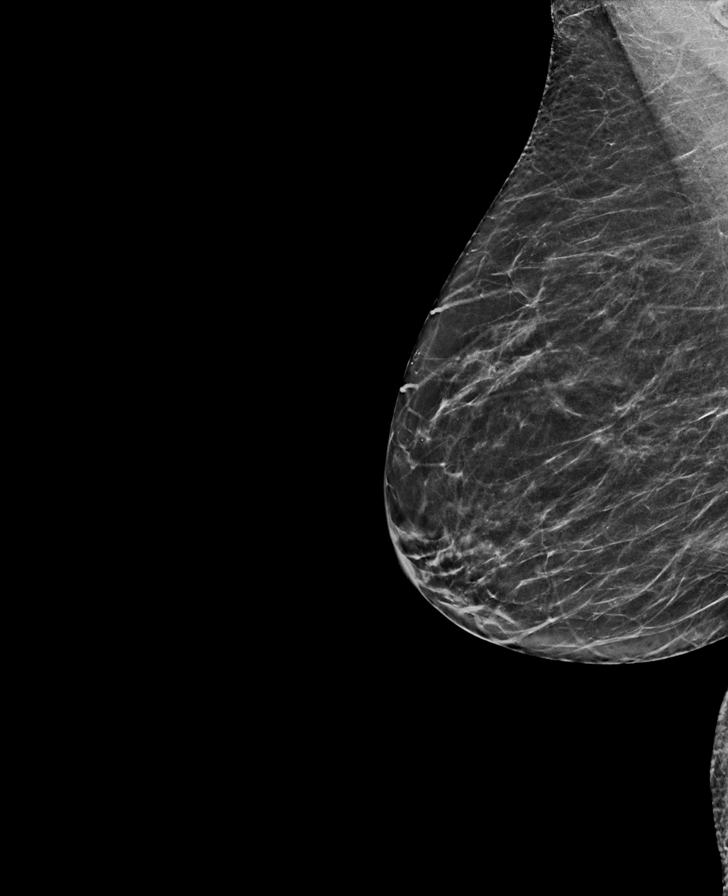

[L MLO synth-2D]
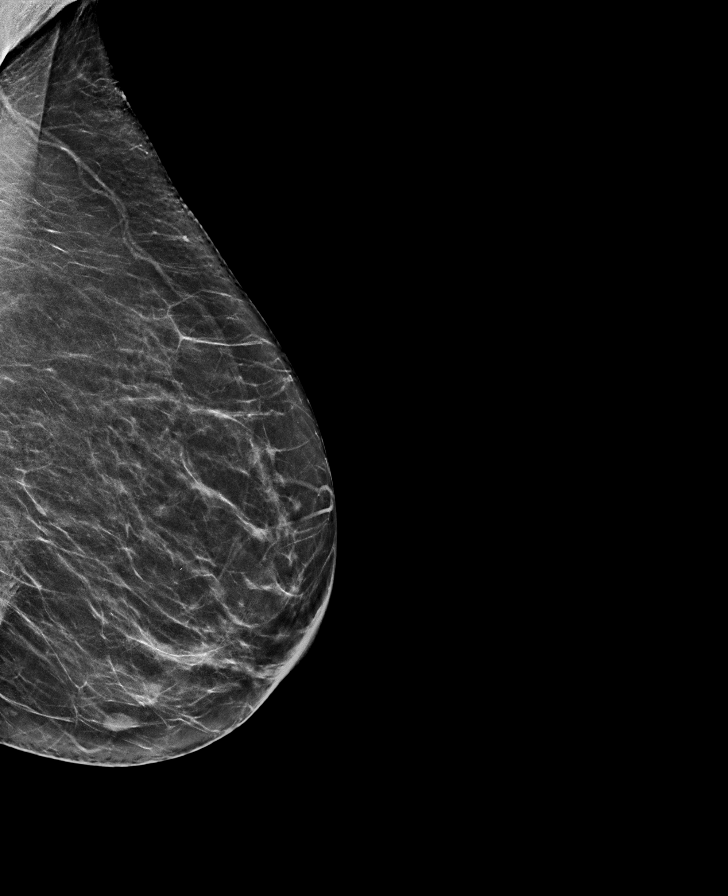

[L CC synth-2D]
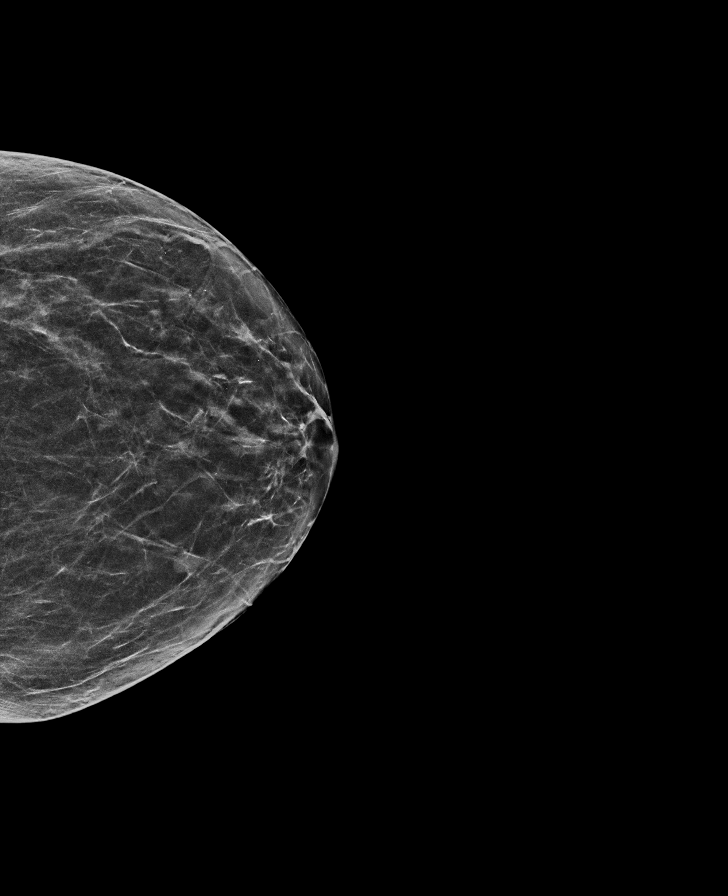

[L CC tomo · tomo slice 31/61.0]
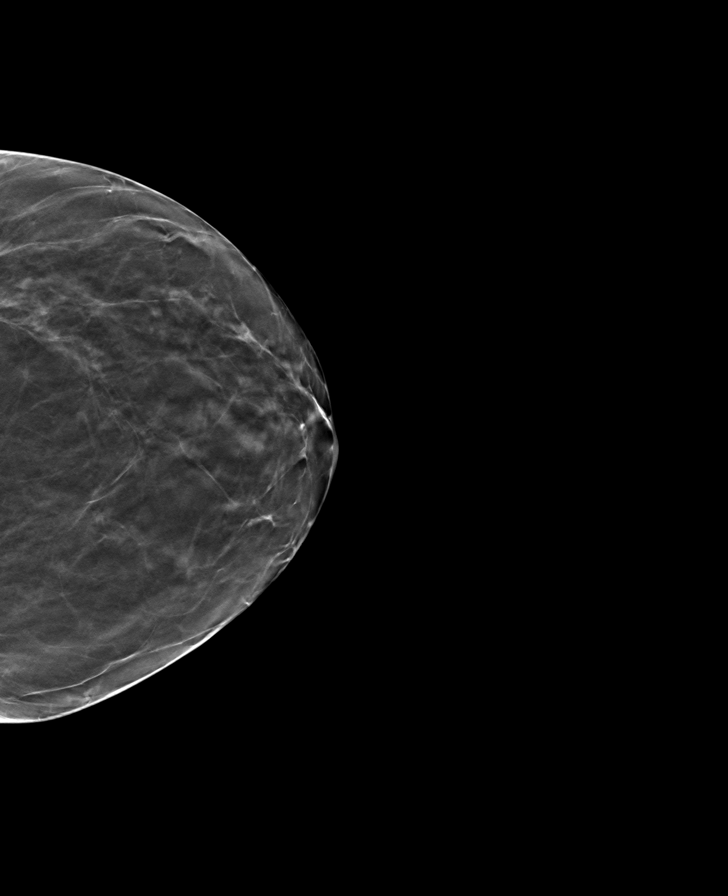

[L MLO tomo · tomo slice 32/63.0]
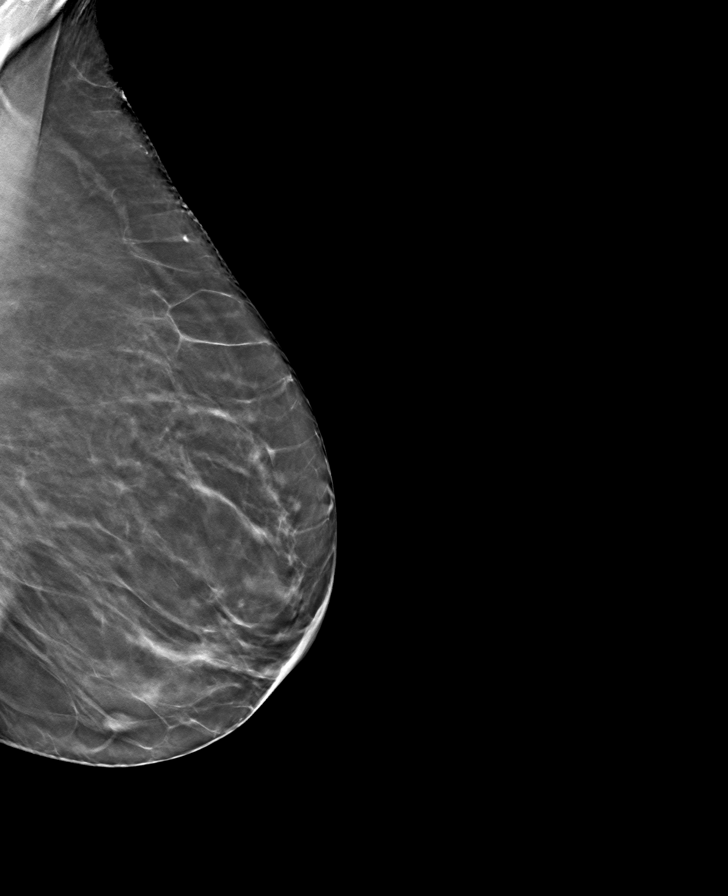

[R CC tomo · tomo slice 27/54.0]
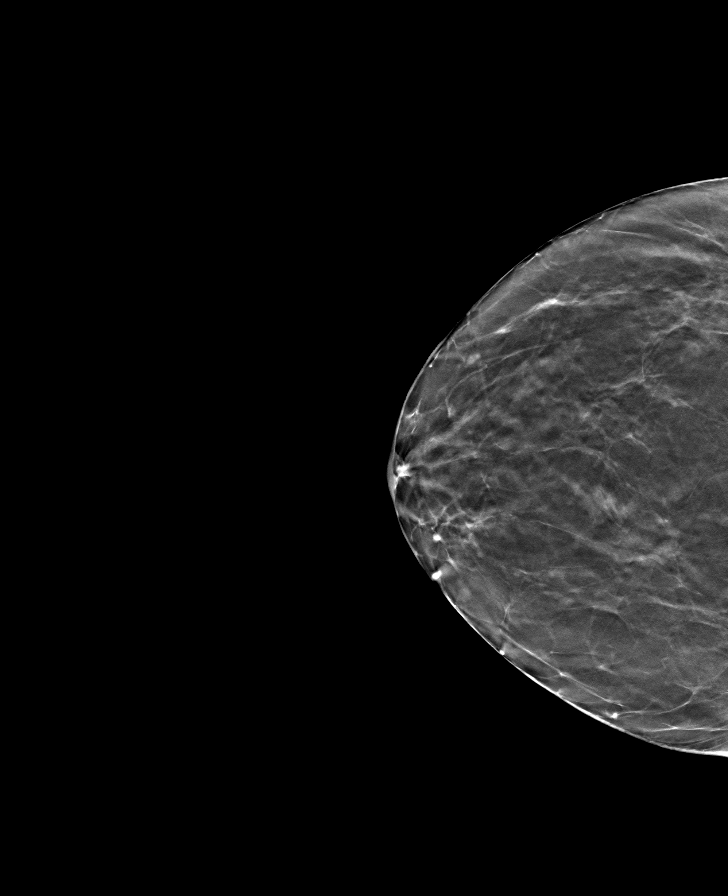

[R MLO tomo · tomo slice 29/57.0]
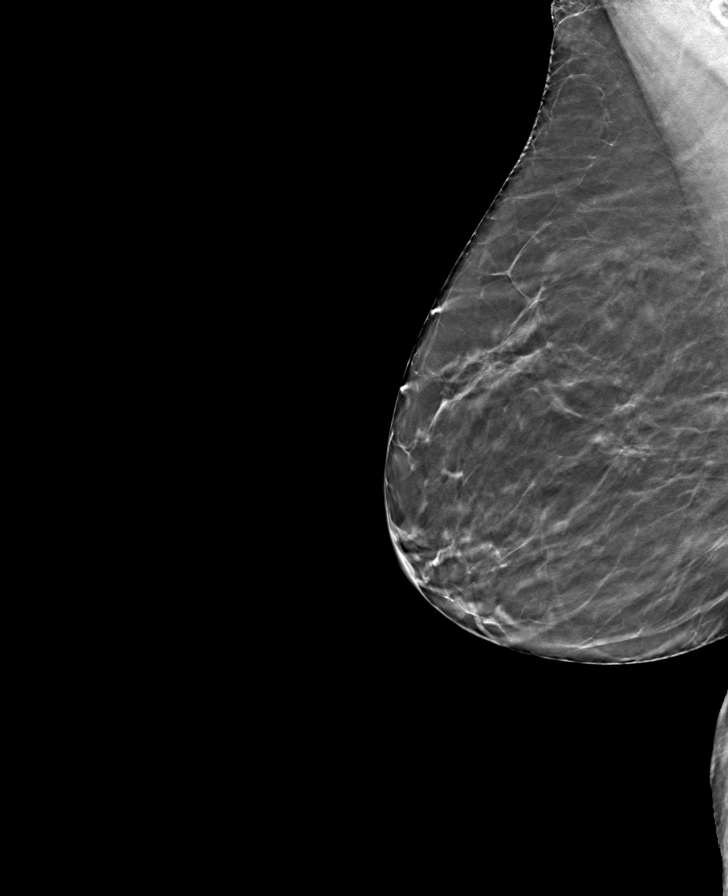

[8 of 24 positions shown; findings below may reference images not displayed]

ACR Breast Density Category b: There are scattered areas of
fibroglandular density.
FINDINGS: There are no findings suspicious for malignancy.
IMPRESSION: No mammographic evidence of malignancy. A result letter of this
screening mammogram will be mailed directly to the patient.

RECOMMENDATION:
Screening mammogram in one year. (Code:51-O-LD2)

BI-RADS CATEGORY  1: Negative.

## 2023-04-16 ENCOUNTER — Ambulatory Visit: Payer: Medicare Other | Admitting: Gastroenterology

## 2023-04-18 ENCOUNTER — Ambulatory Visit: Payer: Medicare Other

## 2023-05-13 ENCOUNTER — Encounter: Payer: Medicare Other | Admitting: Internal Medicine

## 2023-05-14 ENCOUNTER — Ambulatory Visit
Admission: RE | Admit: 2023-05-14 | Discharge: 2023-05-14 | Disposition: A | Discharge status: Home / Self Care | Payer: Medicare Other | Source: Ambulatory Visit | Attending: Student

## 2023-05-14 ENCOUNTER — Other Ambulatory Visit: Payer: Self-pay | Admitting: Student

## 2023-05-14 DIAGNOSIS — R059 Cough, unspecified: Secondary | ICD-10-CM

## 2023-05-25 ENCOUNTER — Encounter: Payer: Self-pay | Admitting: Cardiovascular Disease

## 2023-05-28 ENCOUNTER — Encounter: Payer: Self-pay | Admitting: Internal Medicine

## 2023-05-29 ENCOUNTER — Other Ambulatory Visit: Payer: Self-pay | Admitting: Internal Medicine

## 2023-05-29 DIAGNOSIS — M816 Localized osteoporosis [Lequesne]: Secondary | ICD-10-CM

## 2023-05-31 ENCOUNTER — Encounter: Payer: Self-pay | Admitting: Internal Medicine

## 2023-06-03 ENCOUNTER — Encounter: Payer: Self-pay | Admitting: Internal Medicine

## 2023-06-03 LAB — ESTRADIOL: Estradiol: 39.5 pg/mL (ref 0.0–54.7)

## 2023-06-03 LAB — TESTOSTERONE,FREE AND TOTAL
Testosterone, Free: 2 pg/mL (ref 0.0–4.2)
Testosterone: 177 ng/dL — ABNORMAL HIGH (ref 3–67)

## 2023-06-03 LAB — FOLLICLE STIMULATING HORMONE: FSH: 16.4 m[IU]/mL — ABNORMAL LOW (ref 25.8–134.8)

## 2023-06-03 LAB — VITAMIN D 25 HYDROXY (VIT D DEFICIENCY, FRACTURES): Vit D, 25-Hydroxy: 98.1 ng/mL (ref 30.0–100.0)

## 2023-06-03 LAB — 17-HYDROXYPROGESTERONE: 17-OH Progesterone LCMS: 27 ng/dL

## 2023-06-06 ENCOUNTER — Encounter: Payer: Self-pay | Admitting: Internal Medicine

## 2023-06-06 ENCOUNTER — Ambulatory Visit (INDEPENDENT_AMBULATORY_CARE_PROVIDER_SITE_OTHER): Payer: Medicare Other | Admitting: Internal Medicine

## 2023-06-06 VITALS — BP 112/64 | HR 101 | Temp 98.2°F | Resp 16 | Ht 60.0 in | Wt 116.6 lb

## 2023-06-06 DIAGNOSIS — E7801 Familial hypercholesterolemia: Secondary | ICD-10-CM

## 2023-06-06 DIAGNOSIS — R062 Wheezing: Secondary | ICD-10-CM

## 2023-06-06 DIAGNOSIS — Z789 Other specified health status: Secondary | ICD-10-CM

## 2023-06-06 DIAGNOSIS — K219 Gastro-esophageal reflux disease without esophagitis: Secondary | ICD-10-CM

## 2023-06-06 DIAGNOSIS — M816 Localized osteoporosis [Lequesne]: Secondary | ICD-10-CM | POA: Insufficient documentation

## 2023-06-06 HISTORY — DX: Localized osteoporosis (Lequesne): M81.6

## 2023-06-06 MED ORDER — OMEPRAZOLE 20 MG PO CPDR: 20.0000 mg | DELAYED_RELEASE_CAPSULE | Freq: Every day | ORAL | 1 refills | Status: DC

## 2023-06-06 MED ORDER — ALBUTEROL SULFATE HFA 108 (90 BASE) MCG/ACT IN AERS: 2.0000 | INHALATION_SPRAY | RESPIRATORY_TRACT | 1 refills | Status: AC | PRN

## 2023-06-06 NOTE — Assessment & Plan Note (Signed)
Now on Praluent, which started a few weeks ago.  Unable to tolerate statins due to side effects, plan to check fasting labs at follow-up.

## 2023-06-06 NOTE — Progress Notes (Signed)
Established Patient Office Visit  Subjective   Patient ID: Shawna Peterson, female    DOB: [birth year]  Age: 78 y.o. MRN: [id number]  Chief Complaint  Patient presents with   Fatigue    Onset several months sleeping a lot 10-12hrs    HPI  Shawna Peterson presents for follow up on chronic medical conditions.  She has been feeling tired lately but thinks it might be more seasonal depression.  She also had a viral illness back in December and is still having some wheezing and occasional cough.  Osteoporosis: - Bone scan 01/01/23 with osteoporosis in her left forearm with a t score of -2.6. Lumbar spine could not be visualized, normal in the left hip.  -She is now following with a biometric clinic in [facility] for treatment. She had labs and a personalized estrogen/testosterone pellet injected in her hip and is also on oral progesterone therapy. She is also very active with walking and lifting weights and is on vitamin D and calcium supplements.  -Just had labs done, planning to potentially stop pellet treatment due to cost but will need progesterone 6 months after going off pellet.   HLD: -Medications: Show started Praluent since our last office visit, had been on Crestor but had myalgias in the past. Also had been on Zetia at the time of her last labs. -Now following with lipid specialist -Appears to be familial -Last lipid panel: Lipid Panel     Component Value Date/Time   CHOL 280 (H) 08/28/2022 0811   TRIG 124 08/28/2022 0811   HDL 122 08/28/2022 0811   CHOLHDL 2.3 08/28/2022 0811   LDLCALC 138 (H) 08/28/2022 0811   LABVLDL 20 08/28/2022 0811    Meniere's Disease:  -Currently on Meclizine, Mazide 37.5-25 mg as needed - hasn't had to take in 2 years.  -Currently has hearing aids bilateral leg  Genital herpes: -Has a flare about 3-4 times a year -Last flare 4 months ago -Switched to Valtrex at LOV 1 g BID PRN, only had to take once and did well   Environmental  Allergies: -Currently on Allegra, Singulair, Ryaltris - seen by allergist    GERD: -On Prilosec 20 mg, symptoms well controlled   Health Maintenance: -Blood work UTD -Mammogram, 10/22 Birads 1 - had mammograms every 2 years -Cologuard 07/04/20 negative, due in 1 year  -Prevnar 20 due, literature printed for the patient  Patient Active Problem List   Diagnosis Date Noted   Localized osteoporosis without current pathological fracture 06/06/2023   Hyperlipidemia 04/02/2023   Statin intolerance 02/23/2022   Noncompliance with medication regimen 02/23/2022   Atherosclerosis of abdominal aorta (HCC) 11/30/2020   Coronary artery disease involving native coronary artery of native heart 11/30/2020   Gallbladder polyp 02/03/2020   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   Meniere disease 03/17/2019   Stenosing tenosynovitis of finger of right hand 02/10/2017   Pure hypercholesterolemia, unspecified 09/17/2016   Mild intermittent asthma, uncomplicated 08/06/2016   Gastroesophageal reflux disease without esophagitis 06/30/2015   Meniere's disease of both ears 06/30/2015   Situational anxiety 06/30/2015   Vitamin D deficiency 08/10/2011   Past Medical History:  Diagnosis Date   Actinic keratosis    Allergy 1964   Arthritis 2018   GERD (gastroesophageal reflux disease) 2010   Hyperlipidemia 1980   Localized osteoporosis without current pathological fracture 06/06/2023   Menetrier's disease    Situational anxiety 06/30/2015   Past Surgical History:  Procedure Laterality Date  CESAREAN SECTION  1971   EYE SURGERY  2011   Social History   Tobacco Use   Smoking status: Never   Smokeless tobacco: Never   Tobacco comments:    Never smoked  Vaping Use   Vaping status: Never Used  Substance Use Topics   Alcohol use: Yes    Alcohol/week: 2.0 standard drinks of alcohol    Types: 2 Glasses of wine per week    Comment: occasionally   Drug use: Never    Social History   Socioeconomic History   Marital status: Widowed    Spouse name: Not on file   Number of children: 1   Years of education: Not on file   Highest education level: Master's degree (e.g., MA, MS, MEng, MEd, MSW, MBA)  Occupational History   Occupation: Retired   Occupation: Product manager  Tobacco Use   Smoking status: Never   Smokeless tobacco: Never   Tobacco comments:    Never smoked  Vaping Use   Vaping status: Never Used  Substance and Sexual Activity   Alcohol use: Yes    Alcohol/week: 2.0 standard drinks of alcohol    Types: 2 Glasses of wine per week    Comment: occasionally   Drug use: Never   Sexual activity: Not Currently    Birth control/protection: None  Other Topics Concern   Not on file  Social History Narrative   Not on file   Social Drivers of Health   Financial Resource Strain: Low Risk  (06/04/2023)   Overall Financial Resource Strain (CARDIA)    Difficulty of Paying Living Expenses: Not hard at all  Food Insecurity: No Food Insecurity (06/04/2023)   Hunger Vital Sign    Worried About Running Out of Food in the Last Year: Never true    Ran Out of Food in the Last Year: Never true  Transportation Needs: Patient Declined (06/04/2023)   PRAPARE - Transportation    Lack of Transportation (Medical): Patient declined    Lack of Transportation (Non-Medical): Patient declined  Physical Activity: Sufficiently Active (06/04/2023)   Exercise Vital Sign    Days of Exercise per Week: 5 days    Minutes of Exercise per Session: 50 min  Stress: No Stress Concern Present (06/04/2023)   [employer] of Occupational Health - Occupational Stress Questionnaire    Feeling of Stress : Only a little  Social Connections: Unknown (06/04/2023)   Social Connection and Isolation Panel [NHANES]    Frequency of Communication with Friends and Family: Patient declined    Frequency of Social Gatherings with Friends and Family: Twice a  week    Attends Religious Services: Patient declined    Database administrator or Organizations: Patient declined    Attends Banker Meetings: Never    Marital Status: Widowed  Intimate Partner Violence: Unknown (08/09/2021)   Received from [facility]   HITS    Physically Hurt: Not on file    Insult or Talk Down To: Not on file    Threaten Physical Harm: Not on file    Scream or Curse: Not on file   Family Status  Relation Name Status   Father PC (Not Specified)   Mat Aunt  Deceased  No partnership data on file   Family History  Problem Relation Age of Onset   Hyperlipidemia Father    Stroke Father    Breast cancer Maternal Aunt    Allergies  Allergen Reactions  Lorazepam Nausea Only and Other (See Comments)    Other Reaction: Nightmares Other Reaction: Nightmares Other Reaction: Nightmares Other Reaction: Nightmares Other Reaction: Nightmares Other Reaction: Nightmares    Atorvastatin Other (See Comments)   Rosuvastatin Other (See Comments)   Statins Other (See Comments)    Myalgias        Review of Systems  Respiratory:  Positive for cough. Negative for sputum production and shortness of breath.   All other systems reviewed and are negative.     Objective:     BP 112/64   Pulse (!) 101   Temp 98.2 F (36.8 C)   Resp 16   Ht 5' (1.524 m)   Wt 116 lb 9.6 oz (52.9 kg)   SpO2 95%   BMI 22.77 kg/m  BP Readings from Last 3 Encounters:  06/06/23 112/64  03/22/23 134/74  03/12/23 127/63   Wt Readings from Last 3 Encounters:  06/06/23 116 lb 9.6 oz (52.9 kg)  03/22/23 114 lb 12.8 oz (52.1 kg)  03/12/23 115 lb 4 oz (52.3 kg)      Physical Exam Constitutional:      Appearance: Normal appearance.  HENT:     Head: Normocephalic and atraumatic.     Nose: Nose normal.     Mouth/Throat:     Mouth: Mucous membranes are moist.     Pharynx: Oropharynx is clear.  Eyes:     Conjunctiva/sclera: Conjunctivae normal.  Neck:      Comments: No thyromegaly Cardiovascular:     Rate and Rhythm: Normal rate and regular rhythm.  Pulmonary:     Effort: Pulmonary effort is normal.     Breath sounds: Wheezing present.     Comments: Mild inspiratory wheezing throughout  Musculoskeletal:     Cervical back: No tenderness.  Lymphadenopathy:     Cervical: No cervical adenopathy.  Skin:    General: Skin is warm and dry.  Neurological:     General: No focal deficit present.     Mental Status: She is alert. Mental status is at baseline.  Psychiatric:        Mood and Affect: Mood normal.        Behavior: Behavior normal.      No results found for any visits on 06/06/23.  Last CBC Lab Results  Component Value Date   WBC 6.2 05/14/2022   HGB 15.0 05/14/2022   HCT 41.4 05/14/2022   MCV 90.6 05/14/2022   MCH 32.8 05/14/2022   RDW 11.9 05/14/2022   PLT 247 05/14/2022   Last metabolic panel Lab Results  Component Value Date   GLUCOSE 93 05/14/2022   NA 141 05/14/2022   K 4.5 05/14/2022   CL 103 05/14/2022   CO2 27 05/14/2022   BUN 22 05/14/2022   CREATININE 0.73 05/14/2022   EGFR 85 05/14/2022   CALCIUM 9.9 05/14/2022   PHOS 3.5 08/01/2021   PROT 7.2 05/14/2022   ALBUMIN 4.6 08/01/2021   BILITOT 0.4 05/14/2022   ALKPHOS 72 06/03/2021   AST 18 05/14/2022   ALT 18 05/14/2022   ANIONGAP 10 06/03/2021   Last lipids Lab Results  Component Value Date   CHOL 280 (H) 08/28/2022   HDL 122 08/28/2022   LDLCALC 138 (H) 08/28/2022   TRIG 124 08/28/2022   CHOLHDL 2.3 08/28/2022   Last hemoglobin A1c No results found for: "HGBA1C" Last thyroid functions Lab Results  Component Value Date   TSH 3.07 05/14/2022   Last vitamin D Lab Results  Component Value Date   VD25OH 98.1 05/30/2023   Last vitamin B12 and Folate No results found for: "VITAMINB12", "FOLATE"    The ASCVD Risk score (Arnett DK, et al., 2019) failed to calculate for the following reasons:   The valid HDL cholesterol range is 20  to 100 mg/dL    Assessment & Plan:  Wheezing -     Albuterol Sulfate HFA; Inhale 2 puffs into the lungs every 4 (four) hours as needed.  Dispense: 1 each; Refill: 1  Gastroesophageal reflux disease without esophagitis Assessment & Plan: Stable, refill PPI.  Orders: -     Omeprazole; Take 1 capsule (20 mg total) by mouth daily.  Dispense: 90 capsule; Refill: 1  Familial hypercholesterolemia Assessment & Plan: Now on Praluent, which started a few weeks ago.  Unable to tolerate statins due to side effects, plan to check fasting labs at follow-up.   Statin intolerance Assessment & Plan: History of statin intolerance, now on Praluent.   Localized osteoporosis without current pathological fracture Assessment & Plan: Following with the specialty clinic in [place], however she is thinking about discontinuing treatment due to cost.  Will continue to follow, however her vitamin D was elevated on her labs, recommend she decrease to 1000 IUs once daily (currently taking 10,000 IUs of vitamin D daily).     Mild postviral wheezing on pulmonary exam, will prescribe albuterol to use as needed.  Return in about 3 months (around 09/04/2023) for please move CPE to 3 months , physical.    Margarita Mail, DO

## 2023-06-06 NOTE — Assessment & Plan Note (Signed)
Stable, refill PPI.

## 2023-06-06 NOTE — Assessment & Plan Note (Signed)
History of statin intolerance, now on Praluent.

## 2023-06-06 NOTE — Assessment & Plan Note (Signed)
Following with the specialty clinic in [place], however she is thinking about discontinuing treatment due to cost.  Will continue to follow, however her vitamin D was elevated on her labs, recommend she decrease to 1000 IUs once daily (currently taking 10,000 IUs of vitamin D daily).

## 2023-06-14 ENCOUNTER — Encounter: Payer: Medicare Other | Admitting: Internal Medicine

## 2023-06-17 ENCOUNTER — Encounter: Payer: Medicare Other | Admitting: Internal Medicine

## 2023-07-20 IMAGING — CT CT ABD-PELV W/O CM
1 of 2 series · 14 of 32 positions shown, 18 images · non-contrast
Comparison: None.

CLINICAL DATA: Concern for pancreatitis



[Series 2: axial st · axial · 0.63mm/px · z∈[-1169,-769]mm · 14 of 88 slices shown, 18 images]
[im 4/88  soft-tissue]
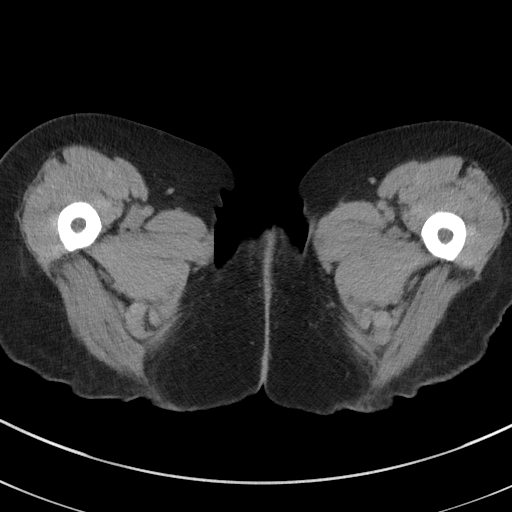
[im 4/88  bone]
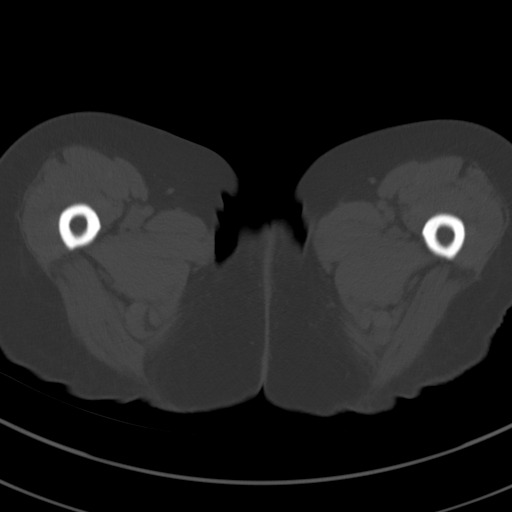
[im 11/88  soft-tissue]
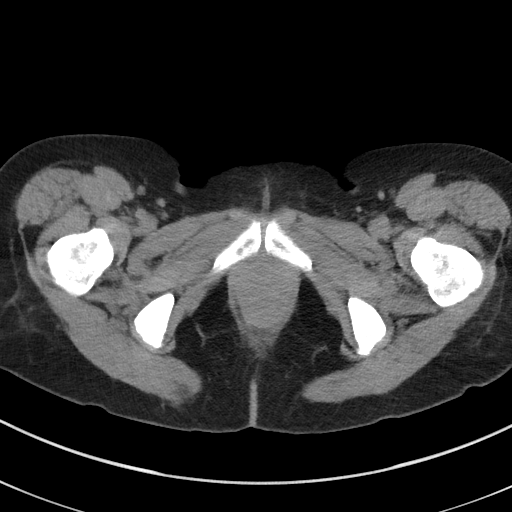
[im 19/88  soft-tissue]
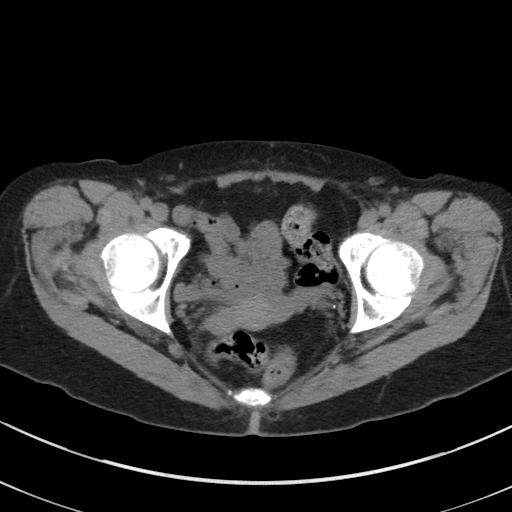
[im 26/88  soft-tissue]
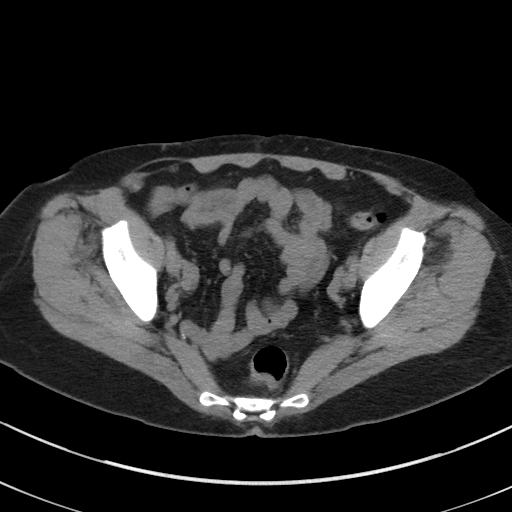
[im 33/88  soft-tissue]
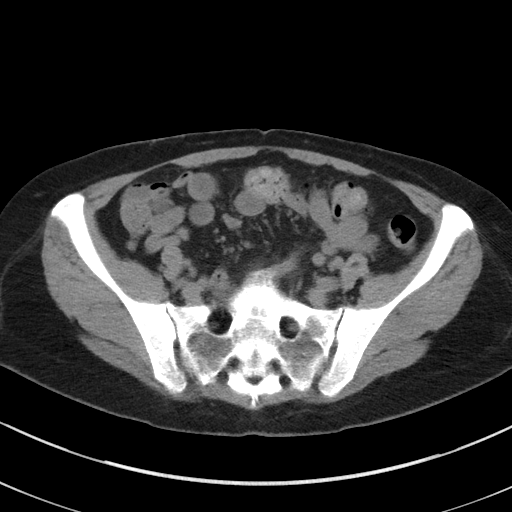
[im 40/88  soft-tissue]
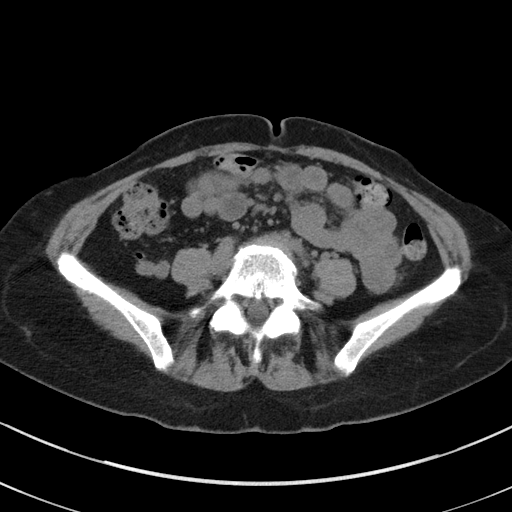
[im 48/88  soft-tissue]
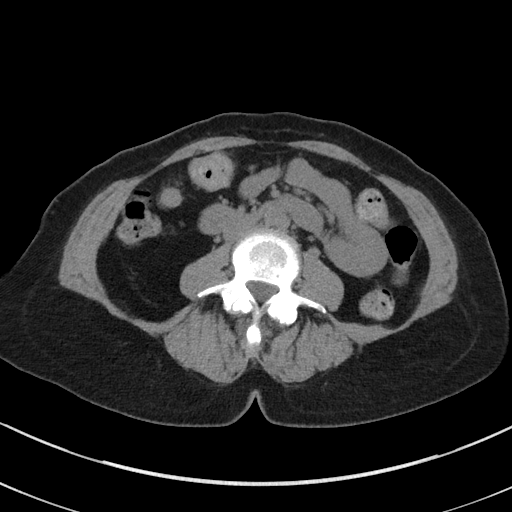
[im 55/88  soft-tissue]
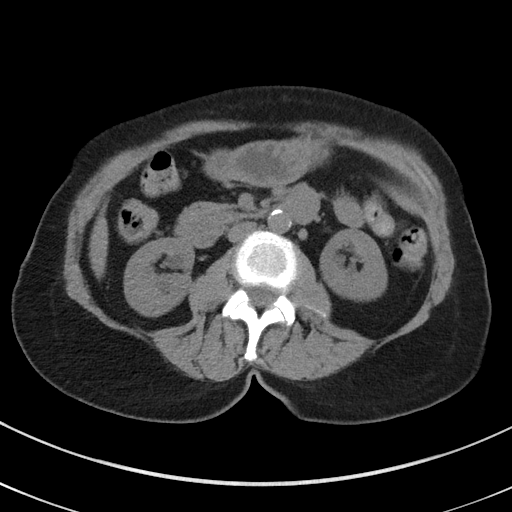
[im 62/88  soft-tissue]
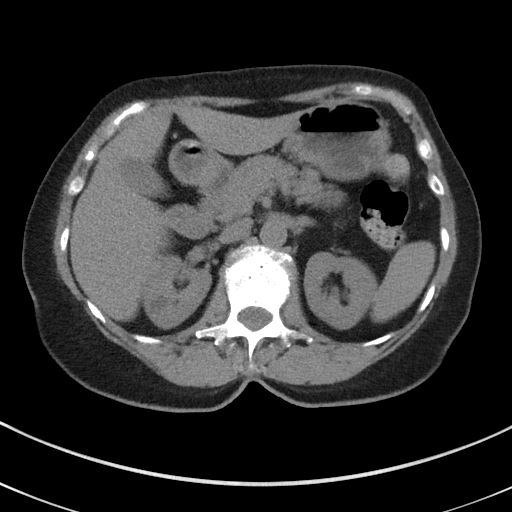
[im 62/88  bone]
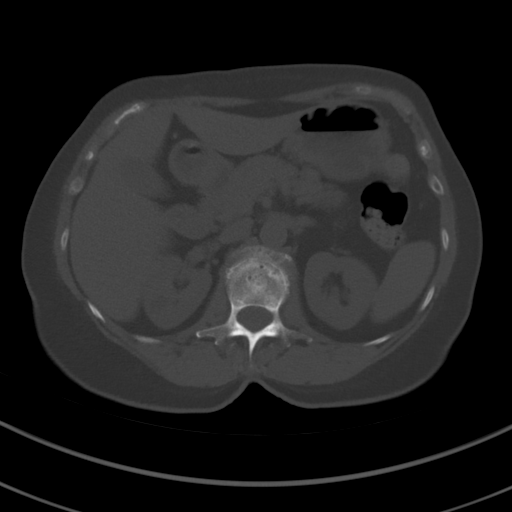
[im 69/88  soft-tissue]
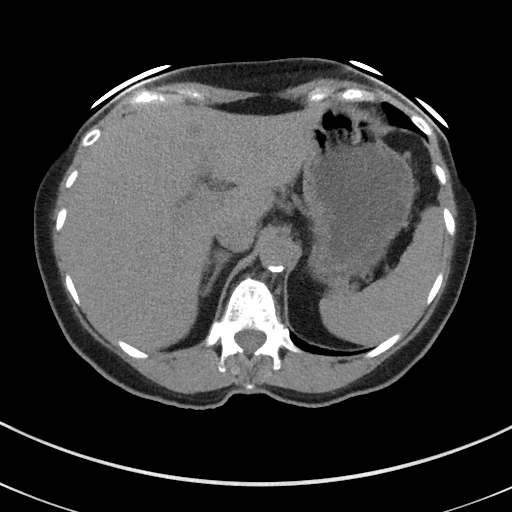
[im 73/88  lung]
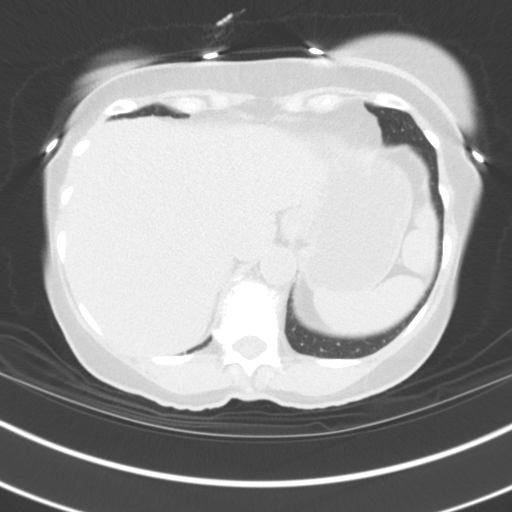
[im 77/88  soft-tissue]
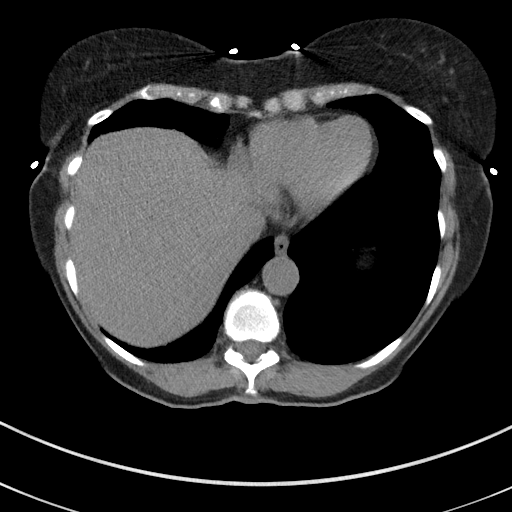
[im 77/88  lung]
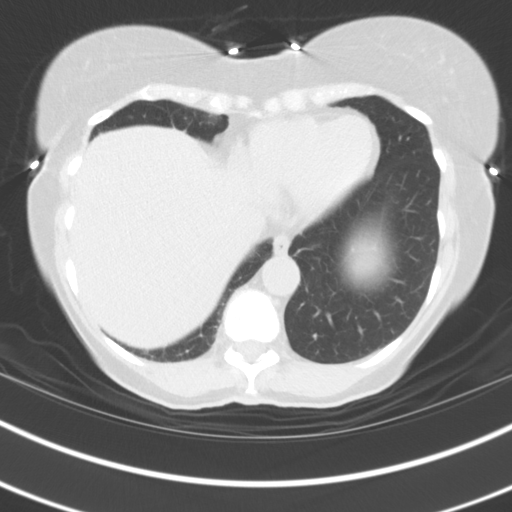
[im 80/88  lung]
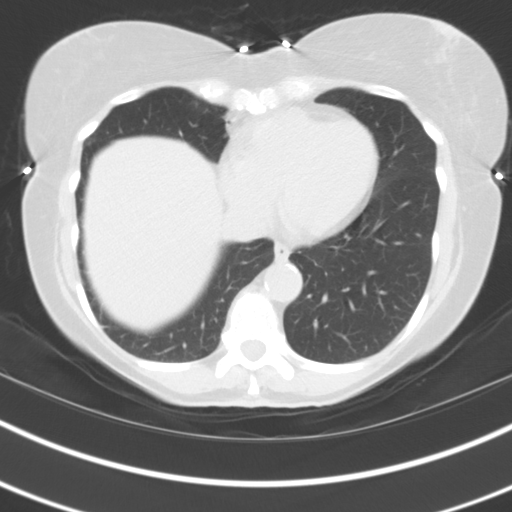
[im 84/88  soft-tissue]
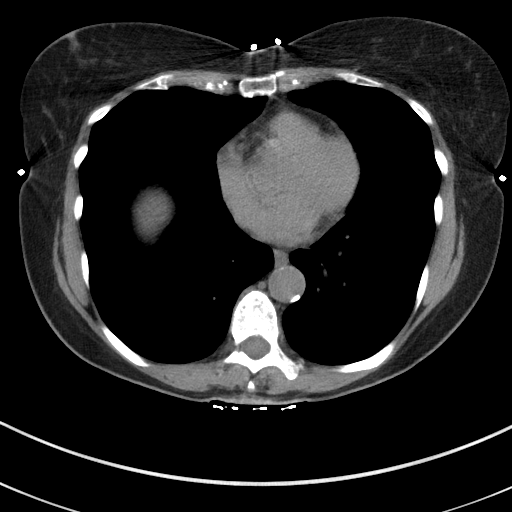
[im 84/88  lung]
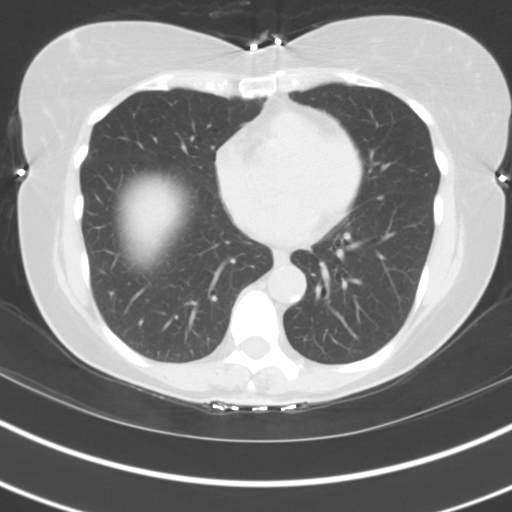

[14 of 32 positions shown; findings below may reference images not displayed]

FINDINGS: Lower chest: No acute abnormality.

Hepatobiliary: Hypodense 5 mm lesion left lobe of the liver on image
[DATE] is technically too small to accurately characterize and
incompletely evaluated without intravenous contrast material but
statistically likely to reflect a benign etiology such as a cyst or
hemangioma. Gallbladder is unremarkable. No biliary ductal dilation.

Pancreas: No pancreatic ductal dilation or evidence of acute
inflammation.

Spleen: Normal in size without focal abnormality.

Adrenals/Urinary Tract: Bilateral adrenal glands appear normal.
Hypodense 8 mm right upper pole renal lesion is too small to
accurately characterize and incompletely evaluated without
intravenous contrast material but statistically likely reflect
cysts. No hydronephrosis. No renal, ureteral or bladder calculi
identified. Urinary bladder is decompressed limiting evaluation.

Stomach/Bowel: No enteric contrast was administered. Stomach is
distended with ingested material without wall thickening. No
pathologic dilation of small or large bowel. The appendix and
terminal ileum appear normal. No evidence of acute bowel
inflammation. No suspicious colonic wall thickening or mass like
lesions visualized. No evidence of acute bowel inflammation.

Vascular/Lymphatic: Aortic atherosclerosis without aneurysmal
dilation. No pathologically enlarged abdominal or pelvic lymph
nodes.

Reproductive: Uterus and bilateral adnexa are unremarkable.

Other: No significant abdominopelvic free fluid.

Musculoskeletal: Multilevel degenerative changes spine. No acute
osseous abnormality.
IMPRESSION: 1. No acute abdominopelvic findings. Specifically, no CT evidence of
pancreatitis.
2.  Aortic Atherosclerosis ([id number]).

## 2023-08-19 ENCOUNTER — Other Ambulatory Visit: Payer: Self-pay

## 2023-08-19 ENCOUNTER — Encounter: Payer: Self-pay | Admitting: Internal Medicine

## 2023-08-19 ENCOUNTER — Ambulatory Visit (INDEPENDENT_AMBULATORY_CARE_PROVIDER_SITE_OTHER): Payer: Self-pay | Admitting: Internal Medicine

## 2023-08-19 ENCOUNTER — Telehealth: Payer: Self-pay | Admitting: Internal Medicine

## 2023-08-19 VITALS — BP 122/70 | HR 81 | Temp 97.9°F | Resp 16 | Ht 60.0 in | Wt 118.0 lb

## 2023-08-19 DIAGNOSIS — E7801 Familial hypercholesterolemia: Secondary | ICD-10-CM

## 2023-08-19 DIAGNOSIS — Z780 Asymptomatic menopausal state: Secondary | ICD-10-CM | POA: Diagnosis not present

## 2023-08-19 DIAGNOSIS — Z789 Other specified health status: Secondary | ICD-10-CM

## 2023-08-19 DIAGNOSIS — J4520 Mild intermittent asthma, uncomplicated: Secondary | ICD-10-CM

## 2023-08-19 DIAGNOSIS — Z1159 Encounter for screening for other viral diseases: Secondary | ICD-10-CM | POA: Diagnosis not present

## 2023-08-19 DIAGNOSIS — E559 Vitamin D deficiency, unspecified: Secondary | ICD-10-CM

## 2023-08-19 DIAGNOSIS — M816 Localized osteoporosis [Lequesne]: Secondary | ICD-10-CM | POA: Diagnosis not present

## 2023-08-19 DIAGNOSIS — I2510 Atherosclerotic heart disease of native coronary artery without angina pectoris: Secondary | ICD-10-CM

## 2023-08-19 DIAGNOSIS — I700 Atherosclerosis of aorta: Secondary | ICD-10-CM

## 2023-08-19 NOTE — Progress Notes (Signed)
 Established Patient Office Visit  Subjective   Patient ID: Shawna Peterson, female    DOB: 01-06-1946  Age: 78 y.o. MRN: [id number]  Chief Complaint  Patient presents with   Medical Management of Chronic Issues    3 month recheck    HPI  [name] presents for follow up on chronic medical conditions.   Discussed the use of AI scribe software for clinical note transcription with the patient, who gave verbal consent to proceed.  History of Present Illness The patient, with a history of allergies and hormonal imbalances, presents for a three-month follow-up visit. She has been on a costly medication regimen and is considering whether to continue based on the results of upcoming lab work. The patient has also undergone hormone pellet therapy and is due for labs related to that as well. She expresses concern about the cost of the therapy and is considering whether to continue. The patient also mentions that she is due for her yearly check-up and is interested in getting a comprehensive assessment of her health status.   Osteoporosis: - Bone scan 01/01/23 with osteoporosis in her left forearm with a t score of -2.6. Lumbar spine could not be visualized, normal in the left hip.  -She had been following with a biometric clinic in [place] for treatment. She had labs and a personalized estrogen/testosterone pellet injected in her hip and is also on oral progesterone therapy. She is wanting to repeat labs today so she can decide if she will continue this costly treatment. She is also very active with walking and lifting weights and is on vitamin D and calcium supplements.   HLD: -Medications: Praluent for the last 6 months, had been on Crestor but had myalgias in the past. Also had been on Zetia at the time of her last labs. -Now following with lipid specialist -Appears to be familial -Last lipid panel: Lipid Panel     Component Value Date/Time   CHOL 280 (H) 08/28/2022 0811   TRIG 124  08/28/2022 0811   HDL 122 08/28/2022 0811   CHOLHDL 2.3 08/28/2022 0811   LDLCALC 138 (H) 08/28/2022 0811   LABVLDL 20 08/28/2022 0811    Meniere's Disease:  -Currently on Meclizine, Mazide 37.5-25 mg as needed - hasn't had to take in 2 years.  -Currently has hearing aids bilateral leg  Genital herpes: -Has a flare about 3-4 times a year -Last flare 4 months ago -Switched to Valtrex at LOV 1 g BID PRN, only had to take once and did well   Environmental Allergies: -Currently on Allegra, Singulair, Ryaltris - seen by allergist    GERD: -On Prilosec 20 mg, symptoms well controlled   Health Maintenance: -Blood work UTD -Mammogram, 10/22 Birads 1 - had mammograms every 2 years -Cologuard 07/04/20 negative  Patient Active Problem List   Diagnosis Date Noted   Localized osteoporosis without current pathological fracture 06/06/2023   Hyperlipidemia 04/02/2023   Statin intolerance 02/23/2022   Noncompliance with medication regimen 02/23/2022   Atherosclerosis of abdominal aorta (HCC) 11/30/2020   Coronary artery disease involving native coronary artery of native heart 11/30/2020   Gallbladder polyp 02/03/2020   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   Meniere disease 03/17/2019   Stenosing tenosynovitis of finger of right hand 02/10/2017   Pure hypercholesterolemia, unspecified 09/17/2016   Mild intermittent asthma, uncomplicated 08/06/2016   Gastroesophageal reflux disease without esophagitis 06/30/2015   Meniere's disease of both ears 06/30/2015   Situational anxiety  06/30/2015   Vitamin D deficiency 08/10/2011   Past Medical History:  Diagnosis Date   Actinic keratosis    Allergy 1964   Arthritis 2018   GERD (gastroesophageal reflux disease) 2010   Hyperlipidemia 1980   Localized osteoporosis without current pathological fracture 06/06/2023   Menetrier's disease    Situational anxiety 06/30/2015   Past Surgical History:  Procedure  Laterality Date   CESAREAN SECTION  1971   EYE SURGERY  2011   Social History   Tobacco Use   Smoking status: Never   Smokeless tobacco: Never   Tobacco comments:    Never smoked  Vaping Use   Vaping status: Never Used  Substance Use Topics   Alcohol use: Yes    Alcohol/week: 2.0 standard drinks of alcohol    Types: 2 Glasses of wine per week    Comment: occasionally   Drug use: Never   Social History   Socioeconomic History   Marital status: Widowed    Spouse name: Not on file   Number of children: 1   Years of education: Not on file   Highest education level: Master's degree (e.g., MA, MS, MEng, MEd, MSW, MBA)  Occupational History   Occupation: Retired   Occupation: Product manager  Tobacco Use   Smoking status: Never   Smokeless tobacco: Never   Tobacco comments:    Never smoked  Vaping Use   Vaping status: Never Used  Substance and Sexual Activity   Alcohol use: Yes    Alcohol/week: 2.0 standard drinks of alcohol    Types: 2 Glasses of wine per week    Comment: occasionally   Drug use: Never   Sexual activity: Not Currently    Birth control/protection: None  Other Topics Concern   Not on file  Social History Narrative   Not on file   Social Drivers of Health   Financial Resource Strain: Low Risk  (06/04/2023)   Overall Financial Resource Strain (CARDIA)    Difficulty of Paying Living Expenses: Not hard at all  Food Insecurity: No Food Insecurity (06/04/2023)   Hunger Vital Sign    Worried About Running Out of Food in the Last Year: Never true    Ran Out of Food in the Last Year: Never true  Transportation Needs: Patient Declined (06/04/2023)   PRAPARE - Transportation    Lack of Transportation (Medical): Patient declined    Lack of Transportation (Non-Medical): Patient declined  Physical Activity: Sufficiently Active (06/04/2023)   Exercise Vital Sign    Days of Exercise per Week: 5 days    Minutes of Exercise per Session: 50  min  Stress: No Stress Concern Present (06/04/2023)   [employer] of Occupational Health - Occupational Stress Questionnaire    Feeling of Stress : Only a little  Social Connections: Unknown (06/04/2023)   Social Connection and Isolation Panel [NHANES]    Frequency of Communication with Friends and Family: Patient declined    Frequency of Social Gatherings with Friends and Family: Twice a week    Attends Religious Services: Patient declined    Database administrator or Organizations: Patient declined    Attends Banker Meetings: Never    Marital Status: Widowed  Intimate Partner Violence: Unknown (08/09/2021)   Received [facility]   HITS    Physically Hurt: Not on file    Insult or Talk Down To: Not on file    Threaten Physical Harm: Not on file  Scream or Curse: Not on file   Family Status  Relation Name Status   Father PC (Not Specified)   Mat Aunt  Deceased  No partnership data on file   Family History  Problem Relation Age of Onset   Hyperlipidemia Father    Stroke Father    Breast cancer Maternal Aunt    Allergies  Allergen Reactions   Lorazepam Nausea Only and Other (See Comments)    Other Reaction: Nightmares Other Reaction: Nightmares Other Reaction: Nightmares Other Reaction: Nightmares Other Reaction: Nightmares Other Reaction: Nightmares    Atorvastatin Other (See Comments)   Rosuvastatin Other (See Comments)   Statins Other (See Comments)    Myalgias        Review of Systems  All other systems reviewed and are negative.     Objective:     BP 122/70 (Cuff Size: Normal)   Pulse 81   Temp 97.9 F (36.6 C) (Oral)   Resp 16   Ht 5' (1.524 m)   Wt 118 lb (53.5 kg)   SpO2 97%   BMI 23.05 kg/m  BP Readings from Last 3 Encounters:  08/19/23 122/70  06/06/23 112/64  03/22/23 134/74   Wt Readings from Last 3 Encounters:  08/19/23 118 lb (53.5 kg)  06/06/23 116 lb 9.6 oz (52.9 kg)  03/22/23 114 lb 12.8  oz (52.1 kg)      Physical Exam Constitutional:      Appearance: Normal appearance.  HENT:     Head: Normocephalic and atraumatic.     Mouth/Throat:     Mouth: Mucous membranes are moist.     Pharynx: Oropharynx is clear.  Eyes:     Extraocular Movements: Extraocular movements intact.     Conjunctiva/sclera: Conjunctivae normal.     Pupils: Pupils are equal, round, and reactive to light.  Neck:     Comments: No thyromegaly Cardiovascular:     Rate and Rhythm: Normal rate and regular rhythm.  Pulmonary:     Effort: Pulmonary effort is normal.     Breath sounds: Normal breath sounds.  Musculoskeletal:     Cervical back: No tenderness.  Lymphadenopathy:     Cervical: No cervical adenopathy.  Skin:    General: Skin is warm and dry.  Neurological:     General: No focal deficit present.     Mental Status: She is alert. Mental status is at baseline.  Psychiatric:        Mood and Affect: Mood normal.        Behavior: Behavior normal.      No results found for any visits on 08/19/23.  Last CBC Lab Results  Component Value Date   WBC 6.2 05/14/2022   HGB 15.0 05/14/2022   HCT 41.4 05/14/2022   MCV 90.6 05/14/2022   MCH 32.8 05/14/2022   RDW 11.9 05/14/2022   PLT 247 05/14/2022   Last metabolic panel Lab Results  Component Value Date   GLUCOSE 93 05/14/2022   NA 141 05/14/2022   K 4.5 05/14/2022   CL 103 05/14/2022   CO2 27 05/14/2022   BUN 22 05/14/2022   CREATININE 0.73 05/14/2022   EGFR 85 05/14/2022   CALCIUM 9.9 05/14/2022   PHOS 3.5 08/01/2021   PROT 7.2 05/14/2022   ALBUMIN 4.6 08/01/2021   BILITOT 0.4 05/14/2022   ALKPHOS 72 06/03/2021   AST 18 05/14/2022   ALT 18 05/14/2022   ANIONGAP 10 06/03/2021   Last lipids Lab Results  Component Value Date  CHOL 280 (H) 08/28/2022   HDL 122 08/28/2022   LDLCALC 138 (H) 08/28/2022   TRIG 124 08/28/2022   CHOLHDL 2.3 08/28/2022   Last hemoglobin A1c No results found for: "HGBA1C" Last thyroid  functions Lab Results  Component Value Date   TSH 3.07 05/14/2022   Last vitamin D Lab Results  Component Value Date   VD25OH 98.1 05/30/2023   Last vitamin B12 and Folate No results found for: "VITAMINB12", "FOLATE"    The ASCVD Risk score (Arnett DK, et al., 2019) failed to calculate for the following reasons:   The valid HDL cholesterol range is 20 to 100 mg/dL    Assessment & Plan:   Assessment & Plan Hormone Replacement Therapy Monitoring She is considering continuation of hormone replacement therapy due to financial constraints. Labs will inform her decision. - Order labs for testosterone, estradiol, vitamin D, progesterone, and FSH.  Lipid Management She is due for a lipid panel to assess treatment effectiveness. - Order lipid panel.  Allergic Rhinitis Ryaltris nasal spray effectively manages symptoms. Consider OTC options if cost is prohibitive. - Continue Ryaltris nasal spray as needed. - Consider OTC Flonase and Azelastine if cost becomes prohibitive.  Routine Health Screening She is due for annual screenings. Hepatitis C screening advised despite no known exposure. - Order CMP for kidney, liver, and electrolytes. - Order CBC. - Order hepatitis C screening.  General Health Maintenance She maintains regular health check-ups. - Encourage continuation of regular health check-ups.  - Testosterone,Free and Total - Estradiol - Vitamin D (25 hydroxy) - 17-Hydroxyprogesterone - FSH - Hepatitis C Antibody - Lipid Profile - CBC w/Diff/Platelet   Return in about 6 months (around 02/18/2024).    Rockney Cid, DO

## 2023-08-19 NOTE — Telephone Encounter (Signed)
 Pt requesting return call. She is scheduled for AWV but is asking for one sooner than the one that is scheduled.

## 2023-08-22 ENCOUNTER — Encounter: Payer: Self-pay | Admitting: Internal Medicine

## 2023-08-23 ENCOUNTER — Other Ambulatory Visit: Payer: Self-pay | Admitting: Internal Medicine

## 2023-08-23 DIAGNOSIS — K219 Gastro-esophageal reflux disease without esophagitis: Secondary | ICD-10-CM

## 2023-08-25 LAB — CBC WITH DIFFERENTIAL/PLATELET
Basophils Absolute: 0.1 10*3/uL (ref 0.0–0.2)
Basos: 1 %
EOS (ABSOLUTE): 0.2 10*3/uL (ref 0.0–0.4)
Eos: 4 %
Hematocrit: 44 % (ref 34.0–46.6)
Hemoglobin: 15 g/dL (ref 11.1–15.9)
Immature Grans (Abs): 0 10*3/uL (ref 0.0–0.1)
Immature Granulocytes: 0 %
Lymphocytes Absolute: 1.5 10*3/uL (ref 0.7–3.1)
Lymphs: 27 %
MCH: 31.4 pg (ref 26.6–33.0)
MCHC: 34.1 g/dL (ref 31.5–35.7)
MCV: 92 fL (ref 79–97)
Monocytes Absolute: 0.5 10*3/uL (ref 0.1–0.9)
Monocytes: 9 %
Neutrophils Absolute: 3.2 10*3/uL (ref 1.4–7.0)
Neutrophils: 59 %
Platelets: 266 10*3/uL (ref 150–450)
RBC: 4.77 x10E6/uL (ref 3.77–5.28)
RDW: 12.6 % (ref 11.7–15.4)
WBC: 5.5 10*3/uL (ref 3.4–10.8)

## 2023-08-25 LAB — LIPID PANEL
Chol/HDL Ratio: 2 ratio (ref 0.0–4.4)
Cholesterol, Total: 196 mg/dL (ref 100–199)
HDL: 96 mg/dL (ref >39)
LDL Chol Calc (NIH): 72 mg/dL (ref 0–99)
Triglycerides: 171 mg/dL — ABNORMAL HIGH (ref 0–149)
VLDL Cholesterol Cal: 28 mg/dL (ref 5–40)

## 2023-08-25 LAB — COMPREHENSIVE METABOLIC PANEL WITH GFR
ALT: 12 IU/L (ref 0–32)
AST: 18 IU/L (ref 0–40)
Albumin: 4.4 g/dL (ref 3.8–4.8)
Alkaline Phosphatase: 89 IU/L (ref 44–121)
BUN/Creatinine Ratio: 18 (ref 12–28)
BUN: 15 mg/dL (ref 8–27)
Bilirubin Total: 0.4 mg/dL (ref 0.0–1.2)
CO2: 21 mmol/L (ref 20–29)
Calcium: 9.4 mg/dL (ref 8.7–10.3)
Chloride: 104 mmol/L (ref 96–106)
Creatinine, Ser: 0.83 mg/dL (ref 0.57–1.00)
Globulin, Total: 2.5 g/dL (ref 1.5–4.5)
Glucose: 83 mg/dL (ref 70–99)
Potassium: 4.2 mmol/L (ref 3.5–5.2)
Sodium: 140 mmol/L (ref 134–144)
Total Protein: 6.9 g/dL (ref 6.0–8.5)
eGFR: 72 mL/min/{1.73_m2} (ref >59)

## 2023-08-25 LAB — ESTRADIOL: Estradiol: 29 pg/mL (ref 0.0–54.7)

## 2023-08-25 LAB — TESTOSTERONE,FREE AND TOTAL
Testosterone, Free: 0.7 pg/mL (ref 0.0–4.2)
Testosterone: 23 ng/dL (ref 3–67)

## 2023-08-25 LAB — FOLLICLE STIMULATING HORMONE: FSH: 44.5 m[IU]/mL (ref 25.8–134.8)

## 2023-08-25 LAB — VITAMIN D 25 HYDROXY (VIT D DEFICIENCY, FRACTURES): Vit D, 25-Hydroxy: 47.4 ng/mL (ref 30.0–100.0)

## 2023-08-25 LAB — 17-HYDROXYPROGESTERONE: 17-OH Progesterone LCMS: 21 ng/dL

## 2023-09-05 NOTE — Telephone Encounter (Signed)
 Called patient. No sooner appt available than what is scheduled. Left vm with direct line  [facility] direct line [phone]

## 2023-09-10 ENCOUNTER — Encounter: Payer: Self-pay | Admitting: Internal Medicine

## 2023-09-10 LAB — H. PYLORI BREATH TEST: H. pylori Breath Test: NOT DETECTED

## 2023-09-18 NOTE — Telephone Encounter (Signed)
 Patient's appointment moved up to Sep 19, 2023 at 2:00 pm due to a cancellation. Patient aware and is happy to have sooner appointment

## 2023-09-19 ENCOUNTER — Ambulatory Visit (INDEPENDENT_AMBULATORY_CARE_PROVIDER_SITE_OTHER)

## 2023-09-19 DIAGNOSIS — Z0000 Encounter for general adult medical examination without abnormal findings: Secondary | ICD-10-CM | POA: Diagnosis not present

## 2023-09-19 NOTE — Progress Notes (Signed)
 Subjective:   Shawna Peterson is a 78 y.o. who presents for a Medicare Wellness preventive visit.  As a reminder, Annual Wellness Visits don't include a physical exam, and some assessments may be limited, especially if this visit is performed virtually. We may recommend an in-person visit if needed.  Visit Complete: Virtual I connected with  Beatris Lincoln on 09/19/23 by a audio enabled telemedicine application and verified that I am speaking with the correct person using two identifiers.  Patient Location: Home  Provider Location: Office/Clinic  I discussed the limitations of evaluation and management by telemedicine. The patient expressed understanding and agreed to proceed.  Vital Signs: Because this visit was a virtual/telehealth visit, some criteria may be missing or patient reported. Any vitals not documented were not able to be obtained and vitals that have been documented are patient reported.  VideoDeclined- This patient declined Librarian, academic. Therefore the visit was completed with audio only.  Persons Participating in Visit: Patient.  AWV Questionnaire: No: Patient Medicare AWV questionnaire was not completed prior to this visit.  Cardiac Risk Factors include: advanced age (>32men, >39 women);dyslipidemia     Objective:     There were no vitals filed for this visit. There is no height or weight on file to calculate BMI.     09/19/2023    2:07 PM 08/24/2022    8:21 AM 07/29/2021   12:17 PM  Advanced Directives  Does Patient Have a Medical Advance Directive? No Yes Yes  Type of Best boy of [place];Living will  Would patient like information on creating a medical advance directive? No - Patient declined      Current Medications (verified) Outpatient Encounter Medications as of 09/19/2023  Medication Sig   albuterol  (VENTOLIN  HFA) 108 (90 Base) MCG/ACT inhaler Inhale 2 puffs into the lungs every 4 (four)  hours as needed.   Alirocumab  (PRALUENT ) 150 MG/ML SOAJ Inject 1 mL (150 mg total) into the skin every 14 (fourteen) days.   Cholecalciferol 50 MCG (2000 UT) CAPS Take 2,000 Units by mouth daily at 6 (six) AM.   dicyclomine  (BENTYL ) 20 MG tablet Take 1 tablet (20 mg total) by mouth every 8 (eight) hours.   fluticasone (FLONASE) 50 MCG/ACT nasal spray SPRAY 2 SPRAYS INTO EACH NOSTRIL EVERY DAY   ipratropium (ATROVENT ) 0.06 % nasal spray Place 2 sprays into both nostrils 4 (four) times daily.   loratadine (CLARITIN) 10 MG tablet Take by mouth.   montelukast (SINGULAIR) 10 MG tablet Take 10 mg by mouth at bedtime.   Multiple Vitamin (MULTIVITAMIN) tablet Take 1 tablet by mouth daily.   omeprazole  (PRILOSEC) 20 MG capsule Take 1 capsule (20 mg total) by mouth daily.   Probiotic Product (DIGESTIVE ADVANTAGE) CAPS Take 1 capsule by mouth daily.   progesterone (PROMETRIUM) 100 MG capsule Take 100-200 mg by mouth at bedtime.   RYALTRIS 665-25 MCG/ACT SUSP Place 2 sprays into both nostrils 2 (two) times daily.   No facility-administered encounter medications on file as of 09/19/2023.    Allergies (verified) Lorazepam, Atorvastatin, Rosuvastatin, and Statins   History: Past Medical History:  Diagnosis Date   Actinic keratosis    Allergy 1964   Arthritis 2018   GERD (gastroesophageal reflux disease) 2010   Hyperlipidemia 1980   Localized osteoporosis without current pathological fracture 06/06/2023   Menetrier's disease    Situational anxiety 06/30/2015   Past Surgical History:  Procedure Laterality Date   CESAREAN SECTION  1971   EYE SURGERY  2011   Family History  Problem Relation Age of Onset   Hyperlipidemia Father    Stroke Father    Breast cancer Maternal Aunt    Social History   Socioeconomic History   Marital status: Widowed    Spouse name: Not on file   Number of children: 1   Years of education: Not on file   Highest education level: Master's degree (e.g., MA, MS, MEng,  MEd, MSW, MBA)  Occupational History   Occupation: Retired   Occupation: Product manager  Tobacco Use   Smoking status: Never   Smokeless tobacco: Never   Tobacco comments:    Never smoked  Vaping Use   Vaping status: Never Used  Substance and Sexual Activity   Alcohol use: Yes    Alcohol/week: 2.0 standard drinks of alcohol    Types: 2 Glasses of wine per week    Comment: occasionally   Drug use: Never   Sexual activity: Not Currently    Birth control/protection: None  Other Topics Concern   Not on file  Social History Narrative   Not on file   Social Drivers of Health   Financial Resource Strain: Low Risk  (09/19/2023)   Overall Financial Resource Strain (CARDIA)    Difficulty of Paying Living Expenses: Not hard at all  Food Insecurity: No Food Insecurity (09/19/2023)   Hunger Vital Sign    Worried About Running Out of Food in the Last Year: Never true    Ran Out of Food in the Last Year: Never true  Transportation Needs: No Transportation Needs (09/19/2023)   PRAPARE - Administrator, Civil Service (Medical): No    Lack of Transportation (Non-Medical): No  Physical Activity: Sufficiently Active (09/19/2023)   Exercise Vital Sign    Days of Exercise per Week: 3 days    Minutes of Exercise per Session: 50 min  Stress: No Stress Concern Present (09/19/2023)   [employer] of Occupational Health - Occupational Stress Questionnaire    Feeling of Stress : Not at all  Social Connections: Socially Isolated (09/19/2023)   Social Connection and Isolation Panel [NHANES]    Frequency of Communication with Friends and Family: Twice a week    Frequency of Social Gatherings with Friends and Family: Twice a week    Attends Religious Services: Never    Database administrator or Organizations: No    Attends Banker Meetings: Never    Marital Status: Widowed    Tobacco Counseling Counseling given: Not Answered Tobacco comments:  Never smoked    Clinical Intake:  Pre-visit preparation completed: Yes  Pain : No/denies pain     BMI - recorded: 23 Nutritional Status: BMI of 19-24  Normal Nutritional Risks: None Diabetes: No  No results found for: "HGBA1C"   How often do you need to have someone help you when you read instructions, pamphlets, or other written materials from your doctor or pharmacy?: 1 - Never  Interpreter Needed?: No  Information entered by :: Dellie Fergusson, LPN   Activities of Daily Living    09/19/2023    2:09 PM 02/11/2023    9:34 AM  In your present state of health, do you have any difficulty performing the following activities:  Hearing? 1 1  Vision? 0 0  Difficulty concentrating or making decisions? 0 0  Walking or climbing stairs? 0 0  Dressing or bathing? 0 0  Doing errands, shopping? 0  0  Preparing Food and eating ? N   Using the Toilet? N   In the past six months, have you accidently leaked urine? Y   Do you have problems with loss of bowel control? N   Managing your Medications? N   Managing your Finances? N   Housekeeping or managing your Housekeeping? N     Patient Care Team: Rockney Cid, DO as PCP - General (Internal Medicine) O'Neal, Cathay Clonts, MD as PCP - Cardiology (Cardiology) Rosa College, MD as Consulting Physician (Ophthalmology)  Indicate any recent Medical Services you may have received from other than [facility] providers in the past year (date may be approximate).     Assessment:    This is a routine wellness examination for [place].  Hearing/Vision screen Hearing Screening - Comments:: Wears aids, both ears Vision Screening - Comments:: Wears glasses all day- DR.[email or web address]   Goals Addressed             This Visit's Progress    DIET - EAT MORE FRUITS AND VEGETABLES         Depression Screen     09/19/2023    2:06 PM 02/11/2023    9:34 AM 12/04/2022    9:51 AM 08/24/2022    8:20 AM 05/14/2022   10:57 AM 02/15/2022   11:29 AM  12/20/2021    1:01 PM  PHQ 2/9 Scores  PHQ - 2 Score 0 0 0 0 0 0 3  PHQ- 9 Score 0 0 0  0 0 6    Fall Risk     09/19/2023    2:09 PM 06/06/2023    2:11 PM 02/11/2023    9:33 AM 12/04/2022    9:51 AM 08/20/2022   11:49 AM  Fall Risk   Falls in the past year? 0 0 0 0 0  Number falls in past yr: 0 0 0 0 0  Injury with Fall? 0 0 0 0 0  Risk for fall due to : No Fall Risks   No Fall Risks   Follow up Falls prevention discussed;Falls evaluation completed Falls evaluation completed  Falls prevention discussed;Education provided;Falls evaluation completed     MEDICARE RISK AT HOME:  Medicare Risk at Home Any stairs in or around the home?: Yes If so, are there any without handrails?: No Home free of loose throw rugs in walkways, pet beds, electrical cords, etc?: Yes Adequate lighting in your home to reduce risk of falls?: Yes Life alert?: No Use of a cane, walker or w/c?: Yes (CANE IF BACK HURTS) Grab bars in the bathroom?: Yes Shower chair or bench in shower?: Yes Elevated toilet seat or a handicapped toilet?: No  TIMED UP AND GO:  Was the test performed?  No  Cognitive Function: 6CIT completed        09/19/2023    2:10 PM 08/24/2022    8:21 AM 12/20/2021    1:06 PM  6CIT Screen  What Year? 0 points 0 points 0 points  What month? 0 points 0 points 0 points  What time? 0 points 0 points 0 points  Count back from 20 0 points 0 points 0 points  Months in reverse 0 points 0 points 0 points  Repeat phrase 0 points 0 points 0 points  Total Score 0 points 0 points 0 points    Immunizations Immunization History  Administered Date(s) Administered   DTaP 02/05/2003   Dtap, Unspecified 02/05/2003   Fluad Quad(high Dose 65+) 02/06/2017, 01/26/2019   Hepatitis  A 12/30/2012   Hepatitis A, Adult 12/30/2012, 07/10/2013   Hepatitis A, Ped/Adol-2 Dose 12/30/2012   Hepatitis B 02/05/2000, 05/02/2006   Hepatitis B, ADULT 02/05/2000, 05/02/2006   Hepatitis B, PED/ADOLESCENT 02/05/2000,  05/02/2006   Influenza Nasal 02/04/2017   Influenza, High Dose Seasonal PF 01/30/2014, 01/30/2014, 02/09/2015, 02/09/2015, 02/06/2017, 02/06/2017, 02/26/2018, 02/26/2018, 01/26/2019, 01/26/2019   Influenza,inj,Quad PF,6+ Mos 02/11/2013   Influenza-Unspecified 03/08/2012, 03/08/2012, 02/11/2013, 01/24/2016, 02/06/2017   MMR 01/06/1995   PFIZER(Purple Top)SARS-COV-2 Vaccination 06/01/2019, 06/22/2019   PPD Test 10/18/2017, 10/18/2017, 05/13/2020   Pneumococcal Conjugate-13 05/31/2015   Pneumococcal Polysaccharide-23 07/27/2010   Td 05/18/2014, 05/18/2014   Tdap 10/21/2007   Typhoid Inactivated 10/01/2013   Yellow Fever 10/01/2013    Screening Tests Health Maintenance  Topic Date Due   Hepatitis C Screening  Never done   Zoster Vaccines- Shingrix (1 of 2) Never done   COVID-19 Vaccine (3 - Pfizer risk series) 07/20/2019   INFLUENZA VACCINE  12/06/2023   DTaP/Tdap/Td (6 - Td or Tdap) 05/18/2024   Medicare Annual Wellness (AWV)  09/18/2024   DEXA SCAN  12/31/2024   Pneumonia Vaccine 32+ Years old  Completed   HPV VACCINES  Aged Out   Meningococcal B Vaccine  Aged Out    Health Maintenance  Health Maintenance Due  Topic Date Due   Hepatitis C Screening  Never done   Zoster Vaccines- Shingrix (1 of 2) Never done   COVID-19 Vaccine (3 - Pfizer risk series) 07/20/2019   Health Maintenance Items Addressed: UP TO DATE ON BONE DENSITY; AGED OUT ON MAMMOGRAM & COLONOSCOPY; UP TO DATE ON SHOTS EXCEPT PNA, DOESN'T WANT ANYMORE COVID SHOTS  Additional Screening:  Vision Screening: Recommended annual ophthalmology exams for early detection of glaucoma and other disorders of the eye.  Dental Screening: Recommended annual dental exams for proper oral hygiene  Community Resource Referral / Chronic Care Management: CRR required this visit?  No   CCM required this visit?  No   Plan:    I have personally reviewed and noted the following in the patient's chart:   Medical and social  history Use of alcohol, tobacco or illicit drugs  Current medications and supplements including opioid prescriptions. Patient is not currently taking opioid prescriptions. Functional ability and status Nutritional status Physical activity Advanced directives List of other physicians Hospitalizations, surgeries, and ER visits in previous 12 months Vitals Screenings to include cognitive, depression, and falls Referrals and appointments  In addition, I have reviewed and discussed with patient certain preventive protocols, quality metrics, and best practice recommendations. A written personalized care plan for preventive services as well as general preventive health recommendations were provided to patient.   Pinky Bright, LPN   06/20/863   After Visit Summary: (MyChart) Due to this being a telephonic visit, the after visit summary with patients personalized plan was offered to patient via MyChart   Notes: Nothing significant to report at this time.

## 2023-09-19 NOTE — Patient Instructions (Signed)
 Ms. Shawna Peterson , Thank you for taking time out of your busy schedule to complete your Annual Wellness Visit with me. I enjoyed our conversation and look forward to speaking with you again next year. I, as well as your care team,  appreciate your ongoing commitment to your health goals. Please review the following plan we discussed and let me know if I can assist you in the future.  Follow up Visits: Next Medicare AWV with our clinical staff:   10/08/24 @ 8:10 AM BY PHONE Have you seen your provider in the last 6 months (3 months if uncontrolled diabetes)? Yes  Clinician Recommendations:  Aim for 30 minutes of exercise or brisk walking, 6-8 glasses of water, and 5 servings of fruits and vegetables each day. TAKE CARE!      This is a list of the screening recommended for you and due dates:  Health Maintenance  Topic Date Due   Hepatitis C Screening  Never done   Zoster (Shingles) Vaccine (1 of 2) Never done   COVID-19 Vaccine (3 - Pfizer risk series) 07/20/2019   Flu Shot  12/06/2023   DTaP/Tdap/Td vaccine (6 - Td or Tdap) 05/18/2024   Medicare Annual Wellness Visit  09/18/2024   DEXA scan (bone density measurement)  12/31/2024   Pneumonia Vaccine  Completed   HPV Vaccine  Aged Out   Meningitis B Vaccine  Aged Out    Advanced directives: (ACP Link)Information on Advanced Care Planning can be found at [place]  Secretary of [facility] Advance Health Care Directives Advance Health Care Directives. [email or web address]  Advance Care Planning is important because it:  [x]  Makes sure you receive the medical care that is consistent with your values, goals, and preferences  [x]  It provides guidance to your family and loved ones and reduces their decisional burden about whether or not they are making the right decisions based on your wishes.  Follow the link provided in your after visit summary or read over the paperwork we have mailed to you to help you started getting your Advance Directives in place. If you  need assistance in completing these, please reach out to us  so that we can help you!

## 2023-09-23 ENCOUNTER — Ambulatory Visit

## 2023-09-24 ENCOUNTER — Encounter: Payer: Medicare Other | Admitting: Internal Medicine

## 2023-10-01 NOTE — Progress Notes (Unsigned)
 Cardiology Clinic Note   Patient Name: Shawna Peterson Date of Encounter: 10/02/2023  Primary Care Provider:  Rockney Cid, DO Primary Cardiologist:  Oneil Bigness, MD  Patient Profile    Shawna Peterson 78 year old female presents to the clinic today for follow-up evaluation of her coronary artery disease and hyperlipidemia.  Past Medical History    Past Medical History:  Diagnosis Date   Actinic keratosis    Allergy 1964   Arthritis 2018   GERD (gastroesophageal reflux disease) 2010   Hyperlipidemia 1980   Localized osteoporosis without current pathological fracture 06/06/2023   Menetrier's disease    Situational anxiety 06/30/2015   Past Surgical History:  Procedure Laterality Date   CESAREAN SECTION  1971   EYE SURGERY  2011    Allergies  Allergies  Allergen Reactions   Lorazepam Nausea Only and Other (See Comments)    Other Reaction: Nightmares Other Reaction: Nightmares Other Reaction: Nightmares Other Reaction: Nightmares Other Reaction: Nightmares Other Reaction: Nightmares    Atorvastatin Other (See Comments)   Rosuvastatin Other (See Comments)   Statins Other (See Comments)    Myalgias     History of Present Illness    Shawna Peterson has a PMH of hyperlipidemia, and coronary artery calcium.  She was initially referred to cardiology by her PCP for evaluation and recommendations for her hyperlipidemia.  She is noted to have a family history of high cholesterol.  She reported that this is a lifelong issue for her and her son.  She noted that she does not tolerate statins.  She denied history of heart attack and stroke.  She denied elevated blood pressure and diabetes.  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  She noted that her father had a stroke and both her parents died from heart attacks.  She did not smoking and drug use.  She did note that she occasionally drinks wine.  Her LDL cholesterol at the time of her visit was  noted to be 200.  Her coronary calcium score was 24.9 placing her in the 30th percentile.  She previously tried ezetimibe  but did not have a significant reduction in her cholesterol.  She noted leg discomfort.  Her LP(a) was checked and was noted to be less than 8.4.  She was referred to pharmacy lipid clinic for consideration of PCSK9 inhibitor and other potential treatment options.  Follow-up was planned for 6 months.  Pharmacy lipid clinic reviewed recommendations and she was prescribed Praluent .  She presents to the clinic today for follow-up evaluation and states she is doing well with Praluent  injection.  She had her lab work done after her eighth injection.  She is pleased with her LDL results.  She noted that her triglyceride level had increased slightly.  We reviewed this.  At this time she does not wish to start any other medications to lower her triglycerides.  We reviewed purified fish oil.  She expressed understanding.  She continues to be very physically active.  We  will plan follow-up in 12 months.  Today she denies chest pain, shortness of breath, lower extremity edema, fatigue, palpitations, melena, hematuria, hemoptysis, diaphoresis, weakness, presyncope, syncope, orthopnea, and PND.    Home Medications    Prior to Admission medications   Medication Sig Start Date End Date Taking? Authorizing Provider  albuterol  (VENTOLIN  HFA) 108 (90 Base) MCG/ACT inhaler Inhale 2 puffs into the lungs every 4 (four) hours as needed. 06/06/23   Rockney Cid,  DO  Alirocumab  (PRALUENT ) 150 MG/ML SOAJ Inject 1 mL (150 mg total) into the skin every 14 (fourteen) days. 04/08/23   Harrold Lincoln, MD  Cholecalciferol 50 MCG (2000 UT) CAPS Take 2,000 Units by mouth daily at 6 (six) AM.    [provider]  dicyclomine  (BENTYL ) 20 MG tablet Take 1 tablet (20 mg total) by mouth every 8 (eight) hours. 02/21/23   Vanga, Rohini Reddy, MD  fluticasone (FLONASE) 50 MCG/ACT nasal spray SPRAY 2  SPRAYS INTO EACH NOSTRIL EVERY DAY 08/07/16   [provider]  ipratropium (ATROVENT ) 0.06 % nasal spray Place 2 sprays into both nostrils 4 (four) times daily. 06/03/21   Kent Pear, NP  loratadine (CLARITIN) 10 MG tablet Take by mouth.    [provider]  montelukast (SINGULAIR) 10 MG tablet Take 10 mg by mouth at bedtime. 09/21/22   [provider]  Multiple Vitamin (MULTIVITAMIN) tablet Take 1 tablet by mouth daily.    [provider]  omeprazole  (PRILOSEC) 20 MG capsule Take 1 capsule (20 mg total) by mouth daily. 06/06/23   Rockney Cid, DO  Probiotic Product (DIGESTIVE ADVANTAGE) CAPS Take 1 capsule by mouth daily.    [provider]  progesterone (PROMETRIUM) 100 MG capsule Take 100-200 mg by mouth at bedtime. 03/05/23   [provider]  RYALTRIS [phone] MCG/ACT SUSP Place 2 sprays into both nostrils 2 (two) times daily. 09/24/22   [provider]    Family History    Family History  Problem Relation Age of Onset   Hyperlipidemia Father    Stroke Father    Breast cancer Maternal Aunt    She indicated that the status of her father is unknown. She indicated that her maternal aunt is deceased.  Social History    Social History   Socioeconomic History   Marital status: Widowed    Spouse name: Not on file   Number of children: 1   Years of education: Not on file   Highest education level: Master's degree (e.g., MA, MS, MEng, MEd, MSW, MBA)  Occupational History   Occupation: Retired   Occupation: Product manager  Tobacco Use   Smoking status: Never   Smokeless tobacco: Never   Tobacco comments:    Never smoked  Vaping Use   Vaping status: Never Used  Substance and Sexual Activity   Alcohol use: Yes    Alcohol/week: 2.0 standard drinks of alcohol    Types: 2 Glasses of wine per week    Comment: occasionally   Drug use: Never   Sexual activity: Not Currently    Birth  control/protection: None  Other Topics Concern   Not on file  Social History Narrative   Not on file   Social Drivers of Health   Financial Resource Strain: Low Risk  (09/19/2023)   Overall Financial Resource Strain (CARDIA)    Difficulty of Paying Living Expenses: Not hard at all  Food Insecurity: No Food Insecurity (09/19/2023)   Hunger Vital Sign    Worried About Running Out of Food in the Last Year: Never true    Ran Out of Food in the Last Year: Never true  Transportation Needs: No Transportation Needs (09/19/2023)   PRAPARE - Administrator, Civil Service (Medical): No    Lack of Transportation (Non-Medical): No  Physical Activity: Sufficiently Active (09/19/2023)   Exercise Vital Sign    Days of Exercise per Week: 3 days    Minutes of  Exercise per Session: 50 min  Stress: No Stress Concern Present (09/19/2023)   [employer] of Occupational Health - Occupational Stress Questionnaire    Feeling of Stress : Not at all  Social Connections: Socially Isolated (09/19/2023)   Social Connection and Isolation Panel [NHANES]    Frequency of Communication with Friends and Family: Twice a week    Frequency of Social Gatherings with Friends and Family: Twice a week    Attends Religious Services: Never    Database administrator or Organizations: No    Attends Banker Meetings: Never    Marital Status: Widowed  Intimate Partner Violence: Not At Risk (09/19/2023)   Humiliation, Afraid, Rape, and Kick questionnaire    Fear of Current or Ex-Partner: No    Emotionally Abused: No    Physically Abused: No    Sexually Abused: No     Review of Systems    General:  No chills, fever, night sweats or weight changes.  Cardiovascular:  No chest pain, dyspnea on exertion, edema, orthopnea, palpitations, paroxysmal nocturnal dyspnea. Dermatological: No rash, lesions/masses Respiratory: No cough, dyspnea Urologic: No hematuria, dysuria Abdominal:   No nausea, vomiting,  diarrhea, bright red blood per rectum, melena, or hematemesis Neurologic:  No visual changes, wkns, changes in mental status. All other systems reviewed and are otherwise negative except as noted above.  Physical Exam    VS:  BP 112/78 (BP Location: Right Arm, Patient Position: Sitting, Cuff Size: Normal)   Pulse 96   Ht 5' (1.524 m)   Wt 115 lb 6.4 oz (52.3 kg)   SpO2 96%   BMI 22.54 kg/m  , BMI Body mass index is 22.54 kg/m. GEN: Well nourished, well developed, in no acute distress. HEENT: normal. Neck: Supple, no JVD, carotid bruits, or masses. Cardiac: RRR, no murmurs, rubs, or gallops. No clubbing, cyanosis, edema.  Radials/DP/PT 2+ and equal bilaterally.  Respiratory:  Respirations regular and unlabored, clear to auscultation bilaterally. GI: Soft, nontender, nondistended, BS + x 4. MS: no deformity or atrophy. Skin: warm and dry, no rash. Neuro:  Strength and sensation are intact. Psych: Normal affect.  Accessory Clinical Findings    Recent Labs: 08/21/2023: ALT 12; BUN 15; Creatinine, Ser 0.83; Hemoglobin 15.0; Platelets 266; Potassium 4.2; Sodium 140   Recent Lipid Panel    Component Value Date/Time   CHOL 196 08/21/2023 0929   TRIG 171 (H) 08/21/2023 0929   HDL 96 08/21/2023 0929   CHOLHDL 2.0 08/21/2023 0929   LDLCALC 72 08/21/2023 0929         ECG personally reviewed by me today-none today.    Coronary calcium scoring 12/12/2022   EXAM: Coronary Calcium Score   TECHNIQUE: The patient was scanned on a Siemens Somatom scanner. Axial non-contrast 3 mm slices were carried out through the heart. The data set was analyzed on a dedicated work station and scored using the Agatson method.   FINDINGS: Non-cardiac: See separate report from [facility].   Ascending Aorta: Normal size, aortic atherosclerosis   Pericardium: Normal   Coronary arteries: Normal origin of left and right coronary arteries. Distribution of arterial calcifications if  present, as noted below;   LM 0   LAD 24.9   LCx 0   RCA 0   Total 24.9   IMPRESSION AND RECOMMENDATION: 1. Coronary calcium score of 24.9. This was 33rd percentile for age and sex matched control.   2. CAC 1-99 in LAD. CAC-DRS A1/N1.   3. Continue heart  healthy lifestyle and risk factor modification.   Electronically Signed: By: Constancia Delton M.D. On: 12/12/2022 18:02            Assessment & Plan   1.  Coronary artery disease-denies anginal type symptoms.  Underwent coronary calcium scoring was noted to have a score of 24.9.  This placed her in the 30th percentile. Heart healthy low-sodium diet Increase physical activity as tolerated Continue Praluent   Hyperlipidemia-LP(a) less than 8.4.  LDL cholesterol 200 on last check.  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  Statin intolerant.  Repeat lipid panel 08/21/2023 showed LDL of 72, total cholesterol 196, HDL 96 and triglycerides of 171.  Will continue to watch and wait.  Will not add another agent at this time for triglyceride lowering. Continue Praluent  Following with pharmacy lipid clinic  Disposition: Follow-up with Dr. Rolm Clos or me in 12 months.   Chet Cota. Woody Kronberg NP-C     10/02/2023, 4:04 PM [facility] [address] Office [phone] Fax [phone]    I spent 14 minutes examining this patient, reviewing medications, and using patient centered shared decision making involving their cardiac care.   I spent  20 minutes reviewing past medical history,  medications, and prior cardiac tests.

## 2023-10-02 ENCOUNTER — Ambulatory Visit: Attending: General Practice | Admitting: General Practice

## 2023-10-02 ENCOUNTER — Encounter: Payer: Self-pay | Admitting: General Practice

## 2023-10-02 VITALS — BP 112/78 | HR 96 | Ht 60.0 in | Wt 115.4 lb

## 2023-10-02 DIAGNOSIS — R931 Abnormal findings on diagnostic imaging of heart and coronary circulation: Secondary | ICD-10-CM | POA: Diagnosis present

## 2023-10-02 DIAGNOSIS — E785 Hyperlipidemia, unspecified: Secondary | ICD-10-CM | POA: Insufficient documentation

## 2023-10-02 NOTE — Patient Instructions (Signed)
 Medication Instructions:  Your physician recommends that you continue on your current medications as directed. Please refer to the Current Medication list given to you today.  *If you need a refill on your cardiac medications before your next appointment, please call your pharmacy*  Lab Work: NONE ordered at this time of appointment    Testing/Procedures: NONE ordered at this time of appointment    Follow-Up: At [facility], you and your health needs are our priority.  As part of our continuing mission to provide you with exceptional heart care, our providers are all part of one team.  This team includes your primary Cardiologist (physician) and Advanced Practice Providers or APPs (Physician Assistants and Nurse Practitioners) who all work together to provide you with the care you need, when you need it.  Your next appointment:   1 year(s)  Provider:   Oneil Bigness, MD or Lawana Pray, NP          We recommend signing up for the patient portal called "MyChart".  Sign up information is provided on this After Visit Summary.  MyChart is used to connect with patients for Virtual Visits (Telemedicine).  Patients are able to view lab/test results, encounter notes, upcoming appointments, etc.  Non-urgent messages can be sent to your provider as well.   To learn more about what you can do with MyChart, go to [email or web address].   Other Instructions

## 2023-10-09 ENCOUNTER — Telehealth: Payer: Self-pay | Admitting: Cardiovascular Disease

## 2023-10-09 NOTE — Telephone Encounter (Signed)
 Pt c/o medication issue:  1. Name of Medication:  Alirocumab  (PRALUENT ) 150 MG/ML SOAJ  2. How are you currently taking this medication (dosage and times per day)?   3. Are you having a reaction (difficulty breathing--STAT)?   4. What is your medication issue?   Patient is requesting to speak with Kristin, Rph regarding Praluent . She says she just has questions, but declines discussing any further with me.

## 2023-10-10 NOTE — Telephone Encounter (Signed)
 Patient heard about [employer] grants and wanted to know if she qualifies.  Confirmed income < $75,000.  Will sign her up for grant and send information via MyChart.    Patient appreciative of help.

## 2023-11-08 ENCOUNTER — Encounter: Payer: Self-pay | Admitting: Internal Medicine

## 2023-11-13 ENCOUNTER — Encounter: Payer: Self-pay | Admitting: Internal Medicine

## 2023-11-13 ENCOUNTER — Other Ambulatory Visit: Payer: Self-pay

## 2023-11-13 ENCOUNTER — Ambulatory Visit (INDEPENDENT_AMBULATORY_CARE_PROVIDER_SITE_OTHER): Admitting: Internal Medicine

## 2023-11-13 VITALS — BP 120/68 | HR 94 | Temp 97.9°F | Resp 14 | Ht 60.0 in | Wt 114.6 lb

## 2023-11-13 DIAGNOSIS — M816 Localized osteoporosis [Lequesne]: Secondary | ICD-10-CM | POA: Diagnosis not present

## 2023-11-13 NOTE — Patient Instructions (Signed)
 Denosumab Injection (Osteoporosis) What is this medication? DENOSUMAB (den oh SUE mab) prevents and treats osteoporosis. It works by Interior and spatial designer stronger and less likely to break (fracture). It is a monoclonal antibody. This medicine may be used for other purposes; ask your health care provider or pharmacist if you have questions. COMMON BRAND NAME(S): Prolia What should I tell my care team before I take this medication? They need to know if you have any of these conditions: Dental or gum disease Had thyroid or parathyroid (glands located in neck) surgery Having dental surgery or a tooth pulled Kidney disease Low levels of calcium in the blood On dialysis Poor nutrition Thyroid disease Trouble absorbing nutrients from your food An unusual or allergic reaction to denosumab, other medications, foods, dyes, or preservatives Pregnant or trying to get pregnant Breastfeeding How should I use this medication? This medication is injected under the skin. It is given by your care team in a hospital or clinic setting. A special MedGuide will be given to you before each treatment. Be sure to read this information carefully each time. Talk to your care team about the use of this medication in children. Special care may be needed. Overdosage: If you think you have taken too much of this medicine contact a poison control center or emergency room at once. NOTE: This medicine is only for you. Do not share this medicine with others. What if I miss a dose? Keep appointments for follow-up doses. It is important not to miss your dose. Call your care team if you are unable to keep an appointment. What may interact with this medication? Do not take this medication with any of the following: Other medications that contain denosumab This medication may also interact with the following: Medications that lower your chance of fighting infection Steroid medications, such as prednisone or cortisone This  list may not describe all possible interactions. Give your health care provider a list of all the medicines, herbs, non-prescription drugs, or dietary supplements you use. Also tell them if you smoke, drink alcohol, or use illegal drugs. Some items may interact with your medicine. What should I watch for while using this medication? Your condition will be monitored carefully while you are receiving this medication. You may need blood work done while taking this medication. This medication may increase your risk of getting an infection. Call your care team for advice if you get a fever, chills, sore throat, or other symptoms of a cold or flu. Do not treat yourself. Try to avoid being around people who are sick. Tell your dentist and dental surgeon that you are taking this medication. You should not have major dental surgery while on this medication. See your dentist to have a dental exam and fix any dental problems before starting this medication. Take good care of your teeth while on this medication. Make sure you see your dentist for regular follow-up appointments. This medication may cause low levels of calcium in your body. The risk of severe side effects is increased in people with kidney disease. Your care team may prescribe calcium and vitamin D  to help prevent low calcium levels while you take this medication. It is important to take calcium and vitamin D  as directed by your care team. Talk to your care team if you may be pregnant. Serious birth defects may occur if you take this medication during pregnancy and for 5 months after the last dose. You will need a negative pregnancy test before starting this medication. Contraception  is recommended while taking this medication and for 5 months after the last dose. Your care team can help you find the option that works for you. Talk to your care team before breastfeeding. Changes to your treatment plan may be needed. What side effects may I notice from  receiving this medication? Side effects that you should report to your care team as soon as possible: Allergic reactions--skin rash, itching, hives, swelling of the face, lips, tongue, or throat Infection--fever, chills, cough, sore throat, wounds that don't heal, pain or trouble when passing urine, general feeling of discomfort or being unwell Low calcium level--muscle pain or cramps, confusion, tingling, or numbness in the hands or feet Osteonecrosis of the jaw--pain, swelling, or redness in the mouth, numbness of the jaw, poor healing after dental work, unusual discharge from the mouth, visible bones in the mouth Severe bone, joint, or muscle pain Skin infection--skin redness, swelling, warmth, or pain Side effects that usually do not require medical attention (report these to your care team if they continue or are bothersome): Back pain Headache Joint pain Muscle pain Pain in the hands, arms, legs, or feet Runny or stuffy nose Sore throat This list may not describe all possible side effects. Call your doctor for medical advice about side effects. You may report side effects to FDA at 1-800-FDA-1088. Where should I keep my medication? This medication is given in a hospital or clinic. It will not be stored at home. NOTE: This sheet is a summary. It may not cover all possible information. If you have questions about this medicine, talk to your doctor, pharmacist, or health care provider.

## 2023-11-13 NOTE — Progress Notes (Signed)
   Acute Office Visit  Subjective:     Patient ID: Shawna Peterson, female    DOB: August 19, 1945, 78 y.o.   MRN: [id number]  Chief Complaint  Patient presents with   Consult    HPI Patient is in today for osteoporosis medication. DEXA from 8/24 showing t score in forearm -2.6. Was unable to view spine to degenerative changes.   Discussed the use of AI scribe software for clinical note transcription with the patient, who gave verbal consent to proceed.  History of Present Illness Shawna Peterson is a 78 year old female who presents for a follow-up on her recent osteoporosis diagnosis and to discuss treatment options.  She is exploring treatment options for osteoporosis after discontinuing pellet hormone replacement therapy due to cost and breast pain. She is interested in alternative hormone replacement therapies but is concerned about cancer risks.  She manages her weight through portion control and exercise, consuming 1200 to 1800 calories daily. Since April, she has lost approximately five pounds, now weighing 114 pounds.  Her current medications include omeprazole  and Praluent  for cholesterol management. She has declined mRNA-based treatments, preferring to continue with Praluent .    Review of Systems  All other systems reviewed and are negative.       Objective:    BP 120/68 (Cuff Size: Normal)   Pulse 94   Temp 97.9 F (36.6 C) (Oral)   Resp 14   Ht 5' (1.524 m)   Wt 114 lb 9.6 oz (52 kg)   SpO2 98%   BMI 22.38 kg/m  BP Readings from Last 3 Encounters:  11/13/23 120/68  10/02/23 112/78  08/19/23 122/70   Wt Readings from Last 3 Encounters:  11/13/23 114 lb 9.6 oz (52 kg)  10/02/23 115 lb 6.4 oz (52.3 kg)  08/19/23 118 lb (53.5 kg)      Physical Exam Constitutional:      Appearance: Normal appearance.  HENT:     Head: Normocephalic and atraumatic.  Eyes:     Conjunctiva/sclera: Conjunctivae normal.  Cardiovascular:     Rate and Rhythm: Normal  rate and regular rhythm.  Pulmonary:     Effort: Pulmonary effort is normal.     Breath sounds: Normal breath sounds.  Skin:    General: Skin is warm and dry.  Neurological:     General: No focal deficit present.     Mental Status: She is alert. Mental status is at baseline.  Psychiatric:        Mood and Affect: Mood normal.        Behavior: Behavior normal.     No results found for any visits on 11/13/23.      Assessment & Plan:   Assessment & Plan Osteoporosis Recently diagnosed. Discontinued pellet HRT due to cost and breast pain. Discussed alternative treatments and recommended Prolia as a safer non-hormonal option. Explained Prolia's function and potential insurance coverage. - Refer to endocrinologist for osteoporosis treatment. - Provide information on Prolia.  - Ambulatory referral to Endocrinology    Return for already scheduled.  Sharyle Fischer, DO

## 2024-01-07 ENCOUNTER — Encounter: Payer: Self-pay | Admitting: Dermatology

## 2024-01-07 ENCOUNTER — Ambulatory Visit (INDEPENDENT_AMBULATORY_CARE_PROVIDER_SITE_OTHER): Payer: Medicare Other | Admitting: Dermatology

## 2024-01-07 DIAGNOSIS — L570 Actinic keratosis: Secondary | ICD-10-CM | POA: Diagnosis not present

## 2024-01-07 DIAGNOSIS — L578 Other skin changes due to chronic exposure to nonionizing radiation: Secondary | ICD-10-CM | POA: Diagnosis not present

## 2024-01-07 DIAGNOSIS — L820 Inflamed seborrheic keratosis: Secondary | ICD-10-CM

## 2024-01-07 DIAGNOSIS — D692 Other nonthrombocytopenic purpura: Secondary | ICD-10-CM

## 2024-01-07 DIAGNOSIS — Z1283 Encounter for screening for malignant neoplasm of skin: Secondary | ICD-10-CM

## 2024-01-07 DIAGNOSIS — L814 Other melanin hyperpigmentation: Secondary | ICD-10-CM | POA: Diagnosis not present

## 2024-01-07 DIAGNOSIS — D1801 Hemangioma of skin and subcutaneous tissue: Secondary | ICD-10-CM

## 2024-01-07 DIAGNOSIS — D229 Melanocytic nevi, unspecified: Secondary | ICD-10-CM

## 2024-01-07 DIAGNOSIS — L918 Other hypertrophic disorders of the skin: Secondary | ICD-10-CM

## 2024-01-07 DIAGNOSIS — W908XXA Exposure to other nonionizing radiation, initial encounter: Secondary | ICD-10-CM | POA: Diagnosis not present

## 2024-01-07 DIAGNOSIS — L821 Other seborrheic keratosis: Secondary | ICD-10-CM

## 2024-01-07 DIAGNOSIS — B079 Viral wart, unspecified: Secondary | ICD-10-CM | POA: Diagnosis not present

## 2024-01-07 NOTE — Progress Notes (Signed)
 Follow-Up Visit   Subjective  Shawna Peterson is a 78 y.o. female who presents for the following: Skin Cancer Screening and Full Body Skin Exam; no personal hx of skin cancer. Patient has areas of concern on left lower back.   The patient presents for Total-Body Skin Exam (TBSE) for skin cancer screening and mole check. The patient has spots, moles and lesions to be evaluated, some may be new or changing and the patient may have concern these could be cancer.    The following portions of the chart were reviewed this encounter and updated as appropriate: medications, allergies, medical history  Review of Systems:  No other skin or systemic complaints except as noted in HPI or Assessment and Plan.  Objective  Well appearing patient in no apparent distress; mood and affect are within normal limits.  A full examination was performed including scalp, head, eyes, ears, nose, lips, neck, chest, axillae, abdomen, back, buttocks, bilateral upper extremities, bilateral lower extremities, hands, feet, fingers, toes, fingernails, and toenails. All findings within normal limits unless otherwise noted below.   Relevant physical exam findings are noted in the Assessment and Plan.  L lateral thigh x1, L Lower back x1, posterior neck base x1, L upper back x8, L flank x6, R midback x3, (20), R Inframammary x21, L Inframammary x19, intramammary x6, R axillary 3 (49) Stuck on waxy paps with erythema L nasal sidewall Pink scaly macules  Assessment & Plan   SKIN CANCER SCREENING PERFORMED TODAY.  ACTINIC DAMAGE - Chronic condition, secondary to cumulative UV/sun exposure - diffuse scaly erythematous macules with underlying dyspigmentation - Recommend daily broad spectrum sunscreen SPF 30+ to sun-exposed areas, reapply every 2 hours as needed.  - Staying in the shade or wearing long sleeves, sun glasses (UVA+UVB protection) and wide brim hats (4-inch brim around the entire circumference of the hat) are  also recommended for sun protection.  - Call for new or changing lesions.  LENTIGINES, SEBORRHEIC KERATOSES, HEMANGIOMAS - Benign normal skin lesions - Benign-appearing - Call for any changes  MELANOCYTIC NEVI - Tan-brown and/or pink-flesh-colored symmetric macules and papules - Benign appearing on exam today - Observation - Call clinic for new or changing moles - Recommend daily use of broad spectrum spf 30+ sunscreen to sun-exposed areas.   Solar Purpura - Chronic; persistent and recurrent.  Treatable, but not curable.(Lesion of concern) - Violaceous macules and patches - Benign - Related to trauma, age, sun damage and/or use of blood thinners, chronic use of topical and/or oral steroids - Observe - Can use OTC arnica containing moisturizer such as Dermend Bruise Formula if desired - Call for worsening or other concerns  - arms    MULTIPLE BENIGN NEVI   LENTIGINES   ACTINIC ELASTOSIS   SEBORRHEIC KERATOSES   CHERRY ANGIOMA   SOLAR PURPURA (HCC)   INFLAMED SEBORRHEIC KERATOSIS (69) L lateral thigh x1, L Lower back x1, posterior neck base x1, L upper back x8, L flank x6, R midback x3, (20), R Inframammary x21, L Inframammary x19, intramammary x6, R axillary 3 (49) Symptomatic, irritating, patient would like treated. Cautioned patient that treating many lesions will lead to many irritated areas and discomfort for several days. Patient prefers to proceed  Destruction of lesion - L lateral thigh x1, L Lower back x1, posterior neck base x1, L upper back x8, L flank x6, R midback x3, (20) Complexity: simple   Destruction method: cryotherapy   Informed consent: discussed and consent obtained   Timeout:  patient name, date of birth, surgical site, and procedure verified Lesion destroyed using liquid nitrogen: Yes   Region frozen until ice ball extended beyond lesion: Yes   Cryo cycles: 1 or 2. Outcome: patient tolerated procedure well with no complications    Post-procedure details: wound care instructions given   Additional details:  Prior to procedure, discussed risks of blister formation, small wound, skin dyspigmentation, or rare scar following cryotherapy. Recommend Vaseline ointment to treated areas while healing.   SKIN TAG Left post Neck DO NOT BILL Destruction of lesion - Left post Neck  Destruction method: cryotherapy   Informed consent: discussed and consent obtained   Lesion destroyed using liquid nitrogen: Yes   Region frozen until ice ball extended beyond lesion: Yes   Outcome: patient tolerated procedure well with no complications   Post-procedure details: wound care instructions given   Additional details:  Prior to procedure, discussed risks of blister formation, small wound, skin dyspigmentation, or rare scar following cryotherapy. Recommend Vaseline ointment to treated areas while healing.  Do not bill  ACTINIC KERATOSIS L nasal sidewall Actinic keratoses are precancerous spots that appear secondary to cumulative UV radiation exposure/sun exposure over time. They are chronic with expected duration over 1 year. A portion of actinic keratoses will progress to squamous cell carcinoma of the skin. It is not possible to reliably predict which spots will progress to skin cancer and so treatment is recommended to prevent development of skin cancer.  Recommend daily broad spectrum sunscreen SPF 30+ to sun-exposed areas, reapply every 2 hours as needed.  Recommend staying in the shade or wearing long sleeves, sun glasses (UVA+UVB protection) and wide brim hats (4-inch brim around the entire circumference of the hat). Call for new or changing lesions. Destruction of lesion - L nasal sidewall Complexity: simple   Destruction method: cryotherapy   Informed consent: discussed and consent obtained   Timeout:  patient name, date of birth, surgical site, and procedure verified Lesion destroyed using liquid nitrogen: Yes   Region frozen  until ice ball extended beyond lesion: Yes   Cryo cycles: 1 or 2. Outcome: patient tolerated procedure well with no complications   Post-procedure details: wound care instructions given   Additional details:  Prior to procedure, discussed risks of blister formation, small wound, skin dyspigmentation, or rare scar following cryotherapy. Recommend Vaseline ointment to treated areas while healing.   VIRAL WARTS, UNSPECIFIED TYPE Left Lower Leg - Anterior Destruction of lesion - Left Lower Leg - Anterior Complexity: simple   Destruction method: cryotherapy   Informed consent: discussed and consent obtained   Timeout:  patient name, date of birth, surgical site, and procedure verified Lesion destroyed using liquid nitrogen: Yes   Region frozen until ice ball extended beyond lesion: Yes   Cryo cycles: 1 or 2. Outcome: patient tolerated procedure well with no complications   Post-procedure details: wound care instructions given   Additional details:  Prior to procedure, discussed risks of blister formation, small wound, skin dyspigmentation, or rare scar following cryotherapy. Recommend Vaseline ointment to treated areas while healing.   Return in about 1 year (around 01/06/2025) for TBSE.  I, Emerick Ege, CMA am acting as scribe for Boneta Sharps, MD.   Documentation: I have reviewed the above documentation for accuracy and completeness, and I agree with the above.  Boneta Sharps, MD

## 2024-01-07 NOTE — Patient Instructions (Addendum)
 Cryotherapy Aftercare  Wash gently with soap and water everyday.   Apply Vaseline and Band-Aid daily until healed.    Recommend daily broad spectrum sunscreen SPF 30+ to sun-exposed areas, reapply every 2 hours as needed. Call for new or changing lesions.  Staying in the shade or wearing long sleeves, sun glasses (UVA+UVB protection) and wide brim hats (4-inch brim around the entire circumference of the hat) are also recommended for sun protection.       Melanoma ABCDEs  Melanoma is the most dangerous type of skin cancer, and is the leading cause of death from skin disease.  You are more likely to develop melanoma if you: Have light-colored skin, light-colored eyes, or red or blond hair Spend a lot of time in the sun Tan regularly, either outdoors or in a tanning bed Have had blistering sunburns, especially during childhood Have a close family member who has had a melanoma Have atypical moles or large birthmarks  Early detection of melanoma is key since treatment is typically straightforward and cure rates are extremely high if we catch it early.   The first sign of melanoma is often a change in a mole or a new dark spot.  The ABCDE system is a way of remembering the signs of melanoma.  A for asymmetry:  The two halves do not match. B for border:  The edges of the growth are irregular. C for color:  A mixture of colors are present instead of an even brown color. D for diameter:  Melanomas are usually (but not always) greater than 6mm - the size of a pencil eraser. E for evolution:  The spot keeps changing in size, shape, and color.  Please check your skin once per month between visits. You can use a small mirror in front and a large mirror behind you to keep an eye on the back side or your body.   If you see any new or changing lesions before your next follow-up, please call to schedule a visit.  Please continue daily skin protection including broad spectrum sunscreen SPF 30+ to  sun-exposed areas, reapplying every 2 hours as needed when you're outdoors.      Due to recent changes in healthcare laws, you may see results of your pathology and/or laboratory studies on MyChart before the doctors have had a chance to review them. We understand that in some cases there may be results that are confusing or concerning to you. Please understand that not all results are received at the same time and often the doctors may need to interpret multiple results in order to provide you with the best plan of care or course of treatment. Therefore, we ask that you please give us  2 business days to thoroughly review all your results before contacting the office for clarification. Should we see a critical lab result, you will be contacted sooner.   If You Need Anything After Your Visit  If you have any questions or concerns for your doctor, please call our main line at [phone] and press option 4 to reach your doctor's medical assistant. If no one answers, please leave a voicemail as directed and we will return your call as soon as possible. Messages left after 4 pm will be answered the following business day.   You may also send us  a message via MyChart. We typically respond to MyChart messages within 1-2 business days.  For prescription refills, please ask your pharmacy to contact our office. Our fax number is [phone].  If you have an urgent issue when the clinic is closed that cannot wait until the next business day, you can page your doctor at the number below.    Please note that while we do our best to be available for urgent issues outside of office hours, we are not available 24/7.   If you have an urgent issue and are unable to reach us , you may choose to seek medical care at your doctor's office, retail clinic, urgent care center, or emergency room.  If you have a medical emergency, please immediately call 911 or go to the emergency department.  Pager Numbers  - Dr.  Hester: [phone]  - Dr. Jackquline: [phone]  - Dr. Claudene: [phone]   - Dr. Raymund: [phone]  In the event of inclement weather, please call our main line at [phone] for an update on the status of any delays or closures.  Dermatology Medication Tips: Please keep the boxes that topical medications come in in order to help keep track of the instructions about where and how to use these. Pharmacies typically print the medication instructions only on the boxes and not directly on the medication tubes.   If your medication is too expensive, please contact our office at [phone] option 4 or send us  a message through MyChart.   We are unable to tell what your co-pay for medications will be in advance as this is different depending on your insurance coverage. However, we may be able to find a substitute medication at lower cost or fill out paperwork to get insurance to cover a needed medication.   If a prior authorization is required to get your medication covered by your insurance company, please allow us  1-2 business days to complete this process.  Drug prices often vary depending on where the prescription is filled and some pharmacies may offer cheaper prices.  The website www.goodrx.com contains coupons for medications through different pharmacies. The prices here do not account for what the cost may be with help from insurance (it may be cheaper with your insurance), but the website can give you the price if you did not use any insurance.  - You can print the associated coupon and take it with your prescription to the pharmacy.  - You may also stop by our office during regular business hours and pick up a GoodRx coupon card.  - If you need your prescription sent electronically to a different pharmacy, notify our office through [facility] or by phone at [phone] option 4.     Si Usted Necesita Algo Despus de Su Visita  Tambin puede enviarnos un mensaje  a travs de Clinical cytogeneticist. Por lo general respondemos a los mensajes de MyChart en el transcurso de 1 a 2 das hbiles.  Para renovar recetas, por favor pida a su farmacia que se ponga en contacto con nuestra oficina. Randi lakes de fax es [place] [phone].  Si tiene un asunto urgente cuando la clnica est cerrada y que no puede esperar hasta el siguiente da hbil, puede llamar/localizar a su doctor(a) al nmero que aparece a continuacin.   Por favor, tenga en cuenta que aunque hacemos todo lo posible para estar disponibles para asuntos urgentes fuera del horario de [place], no estamos disponibles las 24 horas del da, los 7 [address]  [address] de la [place].   Si tiene un problema urgente y no puede comunicarse con nosotros, puede optar por buscar atencin mdica  en el consultorio de su doctor(a), en una clnica privada, en un  centro de atencin urgente o en una sala de emergencias.  Si tiene Engineer, drilling, por favor llame inmediatamente al 911 o vaya a la sala de emergencias.  Nmeros de bper  - Dr. Hester: [phone]  - Dra. Jackquline: [phone]  - Dr. Claudene: [phone]  - Dra. Kitts: [phone]  En caso de inclemencias del [place], por favor llame a nuestra lnea principal al [phone] para una actualizacin sobre el estado de cualquier retraso o cierre.  Consejos para la medicacin en dermatologa: Por favor, guarde las cajas en las que vienen los medicamentos de uso tpico para ayudarle a seguir las instrucciones sobre dnde y cmo usarlos. Las farmacias generalmente imprimen las instrucciones del medicamento slo en las cajas y no directamente en los tubos del [place].   Si su medicamento es muy caro, por favor, pngase en contacto con landry rieger llamando al [phone] y presione la opcin 4 o envenos un mensaje a travs de Clinical cytogeneticist.   No podemos decirle cul ser su copago por los medicamentos por adelantado ya que esto es diferente dependiendo de la cobertura de  su seguro. Sin embargo, es posible que podamos encontrar un medicamento sustituto a Audiological scientist un formulario para que el seguro cubra el medicamento que se considera necesario.   Si se requiere una autorizacin previa para que su compaa de seguros malta su medicamento, por favor permtanos de 1 a 2 das hbiles para completar este proceso.  Los precios de los medicamentos varan con frecuencia dependiendo del Environmental consultant de dnde se surte la receta y alguna farmacias pueden ofrecer precios ms baratos.  El sitio web www.goodrx.com tiene cupones para medicamentos de Health and safety inspector. Los precios aqu no tienen en cuenta lo que podra costar con la ayuda del seguro (puede ser ms barato con su seguro), pero el sitio web puede darle el precio si no utiliz Tourist information centre manager.  - Puede imprimir el cupn correspondiente y llevarlo con su receta a la farmacia.  - Tambin puede pasar por nuestra oficina durante el horario de atencin regular y Education officer, museum una tarjeta de cupones de GoodRx.  - Si necesita que su receta se enve electrnicamente a una farmacia diferente, informe a nuestra oficina a travs de MyChart de [place] o por telfono llamando al [phone] y presione la opcin 4.

## 2024-02-24 ENCOUNTER — Ambulatory Visit: Admitting: Internal Medicine

## 2024-02-25 ENCOUNTER — Encounter: Payer: Self-pay | Admitting: Cardiovascular Disease

## 2024-02-25 MED ORDER — PRALUENT 150 MG/ML ~~LOC~~ SOAJ: 150.0000 mg | SUBCUTANEOUS | 3 refills | Status: DC

## 2024-02-26 ENCOUNTER — Other Ambulatory Visit (HOSPITAL_COMMUNITY): Payer: Self-pay

## 2024-02-26 ENCOUNTER — Telehealth: Payer: Self-pay | Admitting: Pharmacy Technician

## 2024-02-26 NOTE — Telephone Encounter (Signed)
.  Patient Advocate Encounter Patient has a grant that could be active but is waiting on income verification- this was approved in May    The patient was approved for a [employer] that will help cover the cost of Praluent  Total amount awarded, 2500.00.  Effective: 09/10/23 - 09/08/24   APW:[id number] ERW:[id number] Hmnle:[id number] PI:[id number]  [employer] ID: [id number]   Pharmacy provided with approval and processing information. Patient informed via mychart

## 2024-02-28 ENCOUNTER — Other Ambulatory Visit: Payer: Self-pay | Admitting: Internal Medicine

## 2024-02-28 DIAGNOSIS — K219 Gastro-esophageal reflux disease without esophagitis: Secondary | ICD-10-CM

## 2024-03-02 NOTE — Telephone Encounter (Signed)
 Requested Prescriptions  Pending Prescriptions Disp Refills   omeprazole  (PRILOSEC) 20 MG capsule [Pharmacy Med Name: OMEPRAZOLE  DR 20 MG CAPSULE] 90 capsule 0    Sig: TAKE 1 CAPSULE BY MOUTH EVERY DAY     Gastroenterology: Proton Pump Inhibitors Passed - 03/02/2024 11:42 AM      Passed - Valid encounter within last 12 months    Recent Outpatient Visits           3 months ago Localized osteoporosis without current pathological fracture   [facility] Bernardo Fend, DO   6 months ago Familial hypercholesterolemia   [facility] Bernardo Fend, DO       Future Appointments             In 2 months Dartha Ernst, MD [facility]   In 10 months Claudene Lehmann, MD [facility]

## 2024-03-10 ENCOUNTER — Telehealth: Payer: Self-pay | Admitting: Internal Medicine

## 2024-03-10 NOTE — Telephone Encounter (Signed)
 Copied from CRM [id number]. Topic: Appointments - Scheduling Inquiry for Clinic >> Mar 10, 2024  8:55 AM Mia F wrote: Reason for CRM: Pt will like to see Dr Lemon as a new pt but would like to know if she specializes in hormonal replacement. It is not listed on the [facility]. Please contact

## 2024-03-10 NOTE — Telephone Encounter (Signed)
 Called and left patient VM informing.

## 2024-03-11 ENCOUNTER — Other Ambulatory Visit: Payer: Self-pay | Admitting: Internal Medicine

## 2024-03-11 ENCOUNTER — Encounter: Payer: Self-pay | Admitting: Pharmacist Clinician (PhC)/ Clinical Pharmacy Specialist

## 2024-03-11 DIAGNOSIS — E785 Hyperlipidemia, unspecified: Secondary | ICD-10-CM

## 2024-03-11 MED ORDER — EZETIMIBE 10 MG PO TABS: 10.0000 mg | ORAL_TABLET | Freq: Every day | ORAL | 3 refills | Status: AC

## 2024-03-11 NOTE — Addendum Note (Signed)
 Addended by: Demarcus Thielke L on: 03/11/2024 09:03 AM   Modules accepted: Orders

## 2024-03-12 NOTE — Telephone Encounter (Signed)
 Requested medications are due for refill today.  unsure  Requested medications are on the active medications list.  no  Last refill. Never prescribed  Future visit scheduled.   yes  Notes to clinic.  Please review for refill    Requested Prescriptions  Pending Prescriptions Disp Refills   valACYclovir  (VALTREX ) 1000 MG tablet [Pharmacy Med Name: VALACYCLOVIR  HCL 1 GRAM TABLET] 180 tablet     Sig: TAKE 1 GRAM TWICE A DAY AS NEEDED FOR FLARES.     Antimicrobials:  Antiviral Agents - Anti-Herpetic Passed - 03/12/2024  3:42 PM      Passed - Valid encounter within last 12 months    Recent Outpatient Visits           4 months ago Localized osteoporosis without current pathological fracture   [facility] Bernardo Fend, DO   6 months ago Familial hypercholesterolemia   [facility] Bernardo Fend, DO       Future Appointments             In 10 months Claudene Lehmann, MD [facility]

## 2024-03-17 ENCOUNTER — Ambulatory Visit: Admitting: Internal Medicine

## 2024-03-24 ENCOUNTER — Other Ambulatory Visit: Payer: Self-pay

## 2024-03-24 ENCOUNTER — Encounter: Payer: Self-pay | Admitting: Internal Medicine

## 2024-03-24 ENCOUNTER — Ambulatory Visit (INDEPENDENT_AMBULATORY_CARE_PROVIDER_SITE_OTHER): Admitting: Internal Medicine

## 2024-03-24 VITALS — BP 126/70 | HR 88 | Temp 97.7°F | Resp 16 | Ht 60.0 in | Wt 113.9 lb

## 2024-03-24 DIAGNOSIS — K219 Gastro-esophageal reflux disease without esophagitis: Secondary | ICD-10-CM

## 2024-03-24 DIAGNOSIS — M816 Localized osteoporosis [Lequesne]: Secondary | ICD-10-CM | POA: Diagnosis not present

## 2024-03-24 DIAGNOSIS — Z789 Other specified health status: Secondary | ICD-10-CM

## 2024-03-24 DIAGNOSIS — I2510 Atherosclerotic heart disease of native coronary artery without angina pectoris: Secondary | ICD-10-CM | POA: Diagnosis not present

## 2024-03-24 MED ORDER — DICYCLOMINE HCL 20 MG PO TABS: 20.0000 mg | ORAL_TABLET | Freq: Three times a day (TID) | ORAL | 0 refills | Status: AC | PRN

## 2024-03-24 MED ORDER — OMEPRAZOLE 20 MG PO CPDR: 20.0000 mg | DELAYED_RELEASE_CAPSULE | Freq: Every day | ORAL | 1 refills | Status: AC

## 2024-03-24 NOTE — Patient Instructions (Signed)
Pneumococcal Conjugate Vaccine (PCV20) Injection What is this medication? PNEUMOCOCCAL CONJUGATE VACCINE (NEU mo KOK al kon ju gate vak SEEN) reduces the risk of pneumococcal disease, such as pneumonia. It does not treat pneumococcal disease. It is still possible to get pneumococcal disease after receiving this vaccine, but the symptoms may be less severe or not last as long. It works by helping your immune system learn how to fight off a future infection. This medicine may be used for other purposes; ask your health care provider or pharmacist if you have questions. COMMON BRAND NAME(S): Prevnar 20 What should I tell my care team before I take this medication? They need to know if you have any of these conditions: Bleeding disorder Fever Immune system problems An unusual or allergic reaction to pneumococcal vaccine, diphtheria toxoid, other vaccines, other medications, foods, dyes, or preservatives Pregnant or trying to get pregnant Breastfeeding How should I use this medication? This vaccine is injected into a muscle. It is given by your care team. A copy of Vaccine Information Statements will be given before each vaccination. Be sure to read this information carefully each time. This sheet may change often. Talk to your care team about the use of this medication in children. While it may be given to children as young as 6 weeks for selected conditions, precautions do apply. Overdosage: If you think you have taken too much of this medicine contact a poison control center or emergency room at once. NOTE: This medicine is only for you. Do not share this medicine with others. What if I miss a dose? This does not apply. This medication is not for regular use. What may interact with this medication? Medications for cancer chemotherapy Medications that suppress your immune function Steroid medications, such as prednisone or cortisone This list may not describe all possible interactions. Give  your health care provider a list of all the medicines, herbs, non-prescription drugs, or dietary supplements you use. Also tell them if you smoke, drink alcohol, or use illegal drugs. Some items may interact with your medicine. What should I watch for while using this medication? Visit your care team regularly. Report any side effects to your care team right away. This vaccine, like all vaccines, may not fully protect everyone. What side effects may I notice from receiving this medication? Side effects that you should report to your care team as soon as possible: Allergic reactions--skin rash, itching, hives, swelling of the face, lips, tongue, or throat Side effects that usually do not require medical attention (report these to your care team if they continue or are bothersome): Fatigue Fever Headache Joint pain Muscle pain Pain, redness, or irritation at injection site This list may not describe all possible side effects. Call your doctor for medical advice about side effects. You may report side effects to FDA at 1-800-FDA-1088. Where should I keep my medication? This vaccine is only given by your care team. It will not be stored at home. NOTE: This sheet is a summary. It may not cover all possible information. If you have questions about this medicine, talk to your doctor, pharmacist, or health care provider.  2024 Elsevier/Gold Standard (2021-10-04 00:00:00)

## 2024-03-24 NOTE — Progress Notes (Signed)
 Established Patient Office Visit  Subjective   Patient ID: Shawna Peterson, female    DOB: [birth year]  Age: 78 y.o. MRN: [id number]  Chief Complaint  Patient presents with   Medical Management of Chronic Issues    HPI  Shawna Peterson presents for follow up on chronic medical conditions.   Discussed the use of AI scribe software for clinical note transcription with the patient, who gave verbal consent to proceed.  History of Present Illness  Shawna Peterson is a 78 year old female who presents for a six-month follow-up visit.  She is managing her medication plan with her cardiologist due to financial constraints and is considering a Medicare Advantage plan to reduce costs, particularly for Repatha and Praluent . She is weight training at the gym three times a week to address osteoporosis.  Her current medications include Valtrex , omeprazole , and Zetia . She has recently refilled Valtrex  and omeprazole . Zetia  was reintroduced despite her reaction to statins, as she may not continue Praluent . She takes Prilosec daily and has requested refills. Bentyl  is available for occasional diarrhea, not needed for three months. Allergy management includes Singulair, Ryaltris nasal spray, and allergy shots since June, which are effective.  She is hesitant about vaccines, having not received a flu vaccine this year, and is considering a pneumonia vaccine booster, last received in 2017. She is due for a tetanus booster in January 2026.  She has a ganglion cyst on her hand, compressible and stable, managed with compression and massage. She is exploring hormone replacement therapy with a pharmacist due to cost and side effects of previous treatments.   Osteoporosis: - Bone scan 01/01/23 with osteoporosis in her left forearm with a t score of -2.6. Lumbar spine could not be visualized, normal in the left hip.  -She had been following with a biometric clinic in [place] for treatment. She had labs and  a personalized estrogen/testosterone  pellet injected in her hip and is also on oral progesterone therapy. She is wanting to repeat labs today so she can decide if she will continue this costly treatment. She is also very active with walking and lifting weights and is on vitamin D  and calcium supplements.   HLD: -Medications: Praluent  currently, had been on Crestor but had myalgias in the past. Also had been on Zetia  at the time of her last labs. -Now following with lipid specialist -Appears to be familial -Last lipid panel: Lipid Panel     Component Value Date/Time   CHOL 196 08/21/2023 0929   TRIG 171 (H) 08/21/2023 0929   HDL 96 08/21/2023 0929   CHOLHDL 2.0 08/21/2023 0929   LDLCALC 72 08/21/2023 0929   LABVLDL 28 08/21/2023 0929    Meniere's Disease:  -Currently on Meclizine, Mazide 37.5-25 mg as needed - hasn't had to take in 2 years.  -Currently has hearing aids bilateral leg  Genital herpes: -Has a flare about 3-4 times a year -Switched to Valtrex  at LOV 1 g BID PRN, only had to take once and did well   Environmental Allergies: -Currently on Allegra, Singulair, Ryaltris - seen by allergist and now on allergy shots which have helped   GERD: -On Prilosec 20 mg, symptoms well controlled   Health Maintenance: -Blood work UTD -Mammogram, 10/22 Birads 1 - had mammograms every 2 years -Cologuard 07/04/20 negative  Patient Active Problem List   Diagnosis Date Noted   Localized osteoporosis without current pathological fracture 06/06/2023   Hyperlipidemia 04/02/2023   Statin intolerance 02/23/2022  Noncompliance with medication regimen 02/23/2022   Atherosclerosis of abdominal aorta 11/30/2020   Coronary artery disease involving native coronary artery of native heart 11/30/2020   Gallbladder polyp 02/03/2020   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   Meniere disease 03/17/2019   Stenosing tenosynovitis of finger of right hand 02/10/2017   Pure  hypercholesterolemia, unspecified 09/17/2016   Mild intermittent asthma, uncomplicated 08/06/2016   Gastroesophageal reflux disease without esophagitis 06/30/2015   Meniere's disease of both ears 06/30/2015   Situational anxiety 06/30/2015   Vitamin D  deficiency 08/10/2011   Past Medical History:  Diagnosis Date   Actinic keratosis    Allergy 1964   Arthritis 2018   GERD (gastroesophageal reflux disease) 2010   Hyperlipidemia 1980   Localized osteoporosis without current pathological fracture 06/06/2023   Menetrier's disease    Situational anxiety 06/30/2015   Past Surgical History:  Procedure Laterality Date   CESAREAN SECTION  1971   EYE SURGERY  2011   Social History   Tobacco Use   Smoking status: Never   Smokeless tobacco: Never   Tobacco comments:    Never smoked  Vaping Use   Vaping status: Never Used  Substance Use Topics   Alcohol use: Yes    Alcohol/week: 2.0 standard drinks of alcohol    Types: 2 Glasses of wine per week    Comment: occasionally   Drug use: Never   Social History   Socioeconomic History   Marital status: Widowed    Spouse name: Not on file   Number of children: 1   Years of education: Not on file   Highest education level: Master's degree (e.g., MA, MS, MEng, MEd, MSW, MBA)  Occupational History   Occupation: Retired   Occupation: Product Manager  Tobacco Use   Smoking status: Never   Smokeless tobacco: Never   Tobacco comments:    Never smoked  Vaping Use   Vaping status: Never Used  Substance and Sexual Activity   Alcohol use: Yes    Alcohol/week: 2.0 standard drinks of alcohol    Types: 2 Glasses of wine per week    Comment: occasionally   Drug use: Never   Sexual activity: Not Currently    Birth control/protection: None  Other Topics Concern   Not on file  Social History Narrative   Not on file   Social Drivers of Health   Financial Resource Strain: Low Risk  (03/20/2024)   Overall  Financial Resource Strain (CARDIA)    Difficulty of Paying Living Expenses: Not very hard  Food Insecurity: No Food Insecurity (03/20/2024)   Hunger Vital Sign    Worried About Running Out of Food in the Last Year: Never true    Ran Out of Food in the Last Year: Never true  Transportation Needs: No Transportation Needs (03/20/2024)   PRAPARE - Administrator, Civil Service (Medical): No    Lack of Transportation (Non-Medical): No  Physical Activity: Insufficiently Active (03/20/2024)   Exercise Vital Sign    Days of Exercise per Week: 3 days    Minutes of Exercise per Session: 30 min  Stress: No Stress Concern Present (03/20/2024)   [employer] of Occupational Health - Occupational Stress Questionnaire    Feeling of Stress: Not at all  Social Connections: Moderately Isolated (03/20/2024)   Social Connection and Isolation Panel    Frequency of Communication with Friends and Family: More than three times a week    Frequency  of Social Gatherings with Friends and Family: More than three times a week    Attends Religious Services: Never    Database Administrator or Organizations: Yes    Attends Engineer, Structural: More than 4 times per year    Marital Status: Widowed  Intimate Partner Violence: Not At Risk (09/19/2023)   Humiliation, Afraid, Rape, and Kick questionnaire    Fear of Current or Ex-Partner: No    Emotionally Abused: No    Physically Abused: No    Sexually Abused: No   Family Status  Relation Name Status   Father PC (Not Specified)   Mat Aunt  Deceased  No partnership data on file   Family History  Problem Relation Age of Onset   Hyperlipidemia Father    Stroke Father    Breast cancer Maternal Aunt    Allergies  Allergen Reactions   Lorazepam Nausea Only and Other (See Comments)    Other Reaction: Nightmares Other Reaction: Nightmares Other Reaction: Nightmares Other Reaction: Nightmares Other Reaction: Nightmares Other Reaction:  Nightmares    Atorvastatin Other (See Comments)   Rosuvastatin Other (See Comments)   Statins Other (See Comments)    Myalgias        Review of Systems  All other systems reviewed and are negative.     Objective:     BP 126/70 (Cuff Size: Normal)   Pulse 88   Temp 97.7 F (36.5 C) (Oral)   Resp 16   Ht 5' (1.524 m)   Wt 113 lb 14.4 oz (51.7 kg)   SpO2 97%   BMI 22.24 kg/m  BP Readings from Last 3 Encounters:  03/24/24 126/70  11/13/23 120/68  10/02/23 112/78   Wt Readings from Last 3 Encounters:  03/24/24 113 lb 14.4 oz (51.7 kg)  11/13/23 114 lb 9.6 oz (52 kg)  10/02/23 115 lb 6.4 oz (52.3 kg)      Physical Exam Constitutional:      Appearance: Normal appearance.  HENT:     Head: Normocephalic and atraumatic.  Eyes:     Conjunctiva/sclera: Conjunctivae normal.  Cardiovascular:     Rate and Rhythm: Normal rate and regular rhythm.  Pulmonary:     Effort: Pulmonary effort is normal.     Breath sounds: Normal breath sounds.  Skin:    General: Skin is warm and dry.     Comments: Ganglion cyst on right wrist  Neurological:     General: No focal deficit present.     Mental Status: She is alert. Mental status is at baseline.  Psychiatric:        Mood and Affect: Mood normal.        Behavior: Behavior normal.      No results found for any visits on 03/24/24.  Last CBC Lab Results  Component Value Date   WBC 5.5 08/21/2023   HGB 15.0 08/21/2023   HCT 44.0 08/21/2023   MCV 92 08/21/2023   MCH 31.4 08/21/2023   RDW 12.6 08/21/2023   PLT 266 08/21/2023   Last metabolic panel Lab Results  Component Value Date   GLUCOSE 83 08/21/2023   NA 140 08/21/2023   K 4.2 08/21/2023   CL 104 08/21/2023   CO2 21 08/21/2023   BUN 15 08/21/2023   CREATININE 0.83 08/21/2023   EGFR 72 08/21/2023   CALCIUM 9.4 08/21/2023   PHOS 3.5 08/01/2021   PROT 6.9 08/21/2023   ALBUMIN 4.4 08/21/2023   LABGLOB 2.5 08/21/2023  BILITOT 0.4 08/21/2023   ALKPHOS 89  08/21/2023   AST 18 08/21/2023   ALT 12 08/21/2023   ANIONGAP 10 06/03/2021   Last lipids Lab Results  Component Value Date   CHOL 196 08/21/2023   HDL 96 08/21/2023   LDLCALC 72 08/21/2023   TRIG 171 (H) 08/21/2023   CHOLHDL 2.0 08/21/2023   Last hemoglobin A1c No results found for: HGBA1C Last thyroid functions Lab Results  Component Value Date   TSH 3.07 05/14/2022   Last vitamin D  Lab Results  Component Value Date   VD25OH 47.4 08/21/2023   Last vitamin B12 and Folate No results found for: VITAMINB12, FOLATE    The 10-year ASCVD risk score (Arnett DK, et al., 2019) is: 25.6%    Assessment & Plan:   Assessment & Plan  Gastroesophageal reflux disease Omeprazole  prescription refilled. - Refilled omeprazole  prescription.  Hyperlipidemia Praluent  effectively reduces cardiovascular risk. Financial constraints addressed by considering Medicare Advantage plan. Zetia  added despite potential cross-reactivity with statins due to insurance. - Continue Praluent  and monitor financial situation for potential switch to [facility] Advantage plan. - Refilled Zetia  prescription.  Osteoporosis Managed with exercises. HRT considered but concerns about cancer risk and blood clots. Further education on HRT needed. - Continue weight-bearing exercises and strength training. - Research HRT risks and benefits and consider referral to gynecology for HRT management.  Allergic rhinitis Managed with Singulair, Ryaltris nasal spray, and allergy shots, which have been beneficial. - Continue Singulair, Ryaltris nasal spray, and allergy shots.  Hand ganglion cyst Compressible cyst over a vein. Hand specialist advised against aspiration.  General Health Maintenance Up to date with most vaccinations. Hesitant about pneumonia vaccine. Has not received flu vaccine. TDAP due January 2026. - Provided information on Prevnar 20 vaccine for pneumonia. - Discussed TDAP vaccination due in  January 2026.  - omeprazole  (PRILOSEC) 20 MG capsule; Take 1 capsule (20 mg total) by mouth daily.  Dispense: 90 capsule; Refill: 1 - dicyclomine  (BENTYL ) 20 MG tablet; Take 1 tablet (20 mg total) by mouth every 8 (eight) hours as needed for spasms.  Dispense: 30 tablet; Refill: 0   Return in about 6 months (around 09/21/2024).    Sharyle Fischer, DO

## 2024-03-26 ENCOUNTER — Other Ambulatory Visit: Payer: Self-pay | Admitting: Internal Medicine

## 2024-03-26 ENCOUNTER — Encounter: Payer: Self-pay | Admitting: Internal Medicine

## 2024-03-26 DIAGNOSIS — E559 Vitamin D deficiency, unspecified: Secondary | ICD-10-CM

## 2024-03-27 ENCOUNTER — Other Ambulatory Visit (HOSPITAL_BASED_OUTPATIENT_CLINIC_OR_DEPARTMENT_OTHER): Payer: Self-pay | Admitting: Pharmacist Clinician (PhC)/ Clinical Pharmacy Specialist

## 2024-03-28 LAB — HEPATIC FUNCTION PANEL
ALT: 17 IU/L (ref 0–32)
AST: 17 IU/L (ref 0–40)
Albumin: 4.4 g/dL (ref 3.8–4.8)
Alkaline Phosphatase: 95 IU/L (ref 49–135)
Bilirubin Total: 0.3 mg/dL (ref 0.0–1.2)
Bilirubin, Direct: 0.11 mg/dL (ref 0.00–0.40)
Total Protein: 6.6 g/dL (ref 6.0–8.5)

## 2024-03-28 LAB — LIPID PANEL
Chol/HDL Ratio: 1.7 ratio (ref 0.0–4.4)
Cholesterol, Total: 197 mg/dL (ref 100–199)
HDL: 114 mg/dL
LDL Chol Calc (NIH): 64 mg/dL (ref 0–99)
Triglycerides: 113 mg/dL (ref 0–149)
VLDL Cholesterol Cal: 19 mg/dL (ref 5–40)

## 2024-04-05 ENCOUNTER — Ambulatory Visit: Payer: Self-pay | Admitting: Pharmacist Clinician (PhC)/ Clinical Pharmacy Specialist

## 2024-04-14 ENCOUNTER — Ambulatory Visit: Admitting: Family Medicine

## 2024-04-22 ENCOUNTER — Ambulatory Visit: Admitting: Family Medicine

## 2024-04-26 NOTE — Progress Notes (Unsigned)
 "  Subjective:    Patient ID: Shawna Peterson, female    DOB: Nov 20, 1945, 78 y.o.   MRN: [id number]  HPI  Pt presets to the clinic today to establish care and for management of the conditions listed below.  GERD: Triggered by. She denies breakthrough on omeprazole . There is no upper GI on file.  HLD with aortic atherosclerosis, CAD: Her last LDL was 64, triglycerides 886, 03/2024. She is statin intolerant. She is taking alirocumab   and ezetimibeas prescribed.   Asthma: Mild, intermittent. Managed with montelukast, albuterol  as needed. ECG from 03/2023 reviewed.  Anxiety: Situational: She is not taking any medications for this at this time. She is not seeing a therapist. She denies depression, SI/HI.  Osteoporosis: She is taking calcium and vit d OTC. She tries to get some weight bearing exercise. Bone density from 12/2022 reviewed.  Meniere's: With bilateral hearing loss. She uses ryaltris as prescribed. She does not follow with ENT.  Past Medical History:  Diagnosis Date   Actinic keratosis    Allergy 1964   Arthritis 2018   GERD (gastroesophageal reflux disease) 2010   Hyperlipidemia 1980   Localized osteoporosis without current pathological fracture 06/06/2023   Menetrier's disease    Situational anxiety 06/30/2015    Current Outpatient Medications  Medication Sig Dispense Refill   albuterol  (VENTOLIN  HFA) 108 (90 Base) MCG/ACT inhaler Inhale 2 puffs into the lungs every 4 (four) hours as needed. 1 each 1   Alirocumab  (PRALUENT ) 150 MG/ML SOAJ Inject 1 mL (150 mg total) into the skin every 14 (fourteen) days. 6 mL 3   Cholecalciferol 50 MCG (2000 UT) CAPS Take 2,000 Units by mouth daily at 6 (six) AM.     dicyclomine  (BENTYL ) 20 MG tablet Take 1 tablet (20 mg total) by mouth every 8 (eight) hours as needed for spasms. 30 tablet 0   ezetimibe  (ZETIA ) 10 MG tablet Take 1 tablet (10 mg total) by mouth daily. 90 tablet 3   fluticasone (FLONASE) 50 MCG/ACT nasal spray SPRAY 2 SPRAYS  INTO EACH NOSTRIL EVERY DAY     ipratropium (ATROVENT ) 0.06 % nasal spray Place 2 sprays into both nostrils 4 (four) times daily. 15 mL 12   loratadine (CLARITIN) 10 MG tablet Take by mouth.     montelukast (SINGULAIR) 10 MG tablet Take 10 mg by mouth at bedtime.     Multiple Vitamin (MULTIVITAMIN) tablet Take 1 tablet by mouth daily.     omeprazole  (PRILOSEC) 20 MG capsule Take 1 capsule (20 mg total) by mouth daily. 90 capsule 1   Probiotic Product (DIGESTIVE ADVANTAGE) CAPS Take 1 capsule by mouth daily.     RYALTRIS 665-25 MCG/ACT SUSP Place 2 sprays into both nostrils 2 (two) times daily.     valACYclovir  (VALTREX ) 1000 MG tablet TAKE 1 GRAM TWICE A DAY AS NEEDED FOR FLARES. 60 tablet 1   No current facility-administered medications for this visit.    Allergies[1]  Family History  Problem Relation Age of Onset   Hyperlipidemia Father    Stroke Father    Breast cancer Maternal Aunt     Social History   Socioeconomic History   Marital status: Widowed    Spouse name: Not on file   Number of children: 1   Years of education: Not on file   Highest education level: Master's degree (e.g., MA, MS, MEng, MEd, MSW, MBA)  Occupational History   Occupation: Retired   Occupation: Product Manager  Tobacco Use  Smoking status: Never   Smokeless tobacco: Never   Tobacco comments:    Never smoked  Vaping Use   Vaping status: Never Used  Substance and Sexual Activity   Alcohol use: Yes    Alcohol/week: 2.0 standard drinks of alcohol    Types: 2 Glasses of wine per week    Comment: occasionally   Drug use: Never   Sexual activity: Not Currently    Birth control/protection: None  Other Topics Concern   Not on file  Social History Narrative   Not on file   Social Drivers of Health   Tobacco Use: Low Risk (03/24/2024)   Patient History    Smoking Tobacco Use: Never    Smokeless Tobacco Use: Never    Passive Exposure: Not on file  Financial Resource  Strain: Low Risk (03/20/2024)   Overall Financial Resource Strain (CARDIA)    Difficulty of Paying Living Expenses: Not very hard  Food Insecurity: No Food Insecurity (03/20/2024)   Epic    Worried About Programme Researcher, Broadcasting/film/video in the Last Year: Never true    Ran Out of Food in the Last Year: Never true  Transportation Needs: No Transportation Needs (03/20/2024)   Epic    Lack of Transportation (Medical): No    Lack of Transportation (Non-Medical): No  Physical Activity: Insufficiently Active (03/20/2024)   Exercise Vital Sign    Days of Exercise per Week: 3 days    Minutes of Exercise per Session: 30 min  Stress: No Stress Concern Present (03/20/2024)   [employer] of Occupational Health - Occupational Stress Questionnaire    Feeling of Stress: Not at all  Social Connections: Moderately Isolated (03/20/2024)   Social Connection and Isolation Panel    Frequency of Communication with Friends and Family: More than three times a week    Frequency of Social Gatherings with Friends and Family: More than three times a week    Attends Religious Services: Never    Database Administrator or Organizations: Yes    Attends Engineer, Structural: More than 4 times per year    Marital Status: Widowed  Intimate Partner Violence: Not At Risk (09/19/2023)   Humiliation, Afraid, Rape, and Kick questionnaire    Fear of Current or Ex-Partner: No    Emotionally Abused: No    Physically Abused: No    Sexually Abused: No  Depression (PHQ2-9): Low Risk (03/24/2024)   Depression (PHQ2-9)    PHQ-2 Score: 0  Alcohol Screen: Low Risk (03/20/2024)   Alcohol Screen    Last Alcohol Screening Score (AUDIT): 2  Housing: Low Risk (03/20/2024)   Epic    Unable to Pay for Housing in the Last Year: No    Number of Times Moved in the Last Year: 0    Homeless in the Last Year: No  Utilities: Not At Risk (09/19/2023)   [facility] Utilities    Threatened with loss of utilities: No  Health Literacy: Adequate  Health Literacy (09/19/2023)   B1300 Health Literacy    Frequency of need for help with medical instructions: Never     Constitutional: Denies fever, malaise, fatigue, headache or abrupt weight changes.  HEENT: Denies eye pain, eye redness, ear pain, ringing in the ears, wax buildup, runny nose, nasal congestion, bloody nose, or sore throat. Respiratory: Denies difficulty breathing, shortness of breath, cough or sputum production.   Cardiovascular: Denies chest pain, chest tightness, palpitations or swelling in the hands or feet.  Gastrointestinal: Denies abdominal pain, bloating,  constipation, diarrhea or blood in the stool.  GU: Denies urgency, frequency, pain with urination, burning sensation, blood in urine, odor or discharge. Musculoskeletal: Denies decrease in range of motion, difficulty with gait, muscle pain or joint pain and swelling.  Skin: Denies redness, rashes, lesions or ulcercations.  Neurological: Denies dizziness, difficulty with memory, difficulty with speech or problems with balance and coordination.  Psych: Pt has a history of anxiety. Denies depression, SI/HI.  No other specific complaints in a complete review of systems (except as listed in HPI above).      Objective:   Physical Exam  There were no vitals taken for this visit. Wt Readings from Last 3 Encounters:  03/24/24 113 lb 14.4 oz (51.7 kg)  11/13/23 114 lb 9.6 oz (52 kg)  10/02/23 115 lb 6.4 oz (52.3 kg)    General: Appears their stated age, well developed, well nourished in NAD. Skin: Warm, dry and intact. No rashes, lesions or ulcerations noted. HEENT: Head: normal shape and size; Eyes: sclera white, no icterus, conjunctiva pink, PERRLA and EOMs intact; Ears: Tm's gray and intact, normal light reflex; Nose: mucosa pink and moist, septum midline; Throat/Mouth: Teeth present, mucosa pink and moist, no exudate, lesions or ulcerations noted.  Neck:  Neck supple, trachea midline. No masses, lumps or  thyromegaly present.  Cardiovascular: Normal rate and rhythm. S1,S2 noted.  No murmur, rubs or gallops noted. No JVD or BLE edema. No carotid bruits noted. Pulmonary/Chest: Normal effort and positive vesicular breath sounds. No respiratory distress. No wheezes, rales or ronchi noted.  Abdomen: Soft and nontender. Normal bowel sounds. No distention or masses noted. Liver, spleen and kidneys non palpable. Musculoskeletal: Normal range of motion. No signs of joint swelling. No difficulty with gait.  Neurological: Alert and oriented. Cranial nerves II-XII grossly intact. Coordination normal.  Psychiatric: Mood and affect normal. Behavior is normal. Judgment and thought content normal.    BMET    Component Value Date/Time   NA 140 08/21/2023 0929   K 4.2 08/21/2023 0929   CL 104 08/21/2023 0929   CO2 21 08/21/2023 0929   GLUCOSE 83 08/21/2023 0929   GLUCOSE 93 05/14/2022 1201   BUN 15 08/21/2023 0929   CREATININE 0.83 08/21/2023 0929   CREATININE 0.73 05/14/2022 1201   CALCIUM 9.4 08/21/2023 0929   GFRNONAA >60 06/03/2021 1040    Lipid Panel     Component Value Date/Time   CHOL 197 03/27/2024 0831   TRIG 113 03/27/2024 0831   HDL 114 03/27/2024 0831   CHOLHDL 1.7 03/27/2024 0831   LDLCALC 64 03/27/2024 0831    CBC    Component Value Date/Time   WBC 5.5 08/21/2023 0929   WBC 6.2 05/14/2022 1201   RBC 4.77 08/21/2023 0929   RBC 4.57 05/14/2022 1201   HGB 15.0 08/21/2023 0929   HCT 44.0 08/21/2023 0929   PLT 266 08/21/2023 0929   MCV 92 08/21/2023 0929   MCH 31.4 08/21/2023 0929   MCH 32.8 05/14/2022 1201   MCHC 34.1 08/21/2023 0929   MCHC 36.2 (H) 05/14/2022 1201   RDW 12.6 08/21/2023 0929   LYMPHSABS 1.5 08/21/2023 0929   MONOABS 0.7 06/03/2021 1040   EOSABS 0.2 08/21/2023 0929   BASOSABS 0.1 08/21/2023 0929    Hgb A1C No results found for: HGBA1C          Assessment & Plan:   RTC in 6 months for followup of chronic conditions Angeline Laura, NP      [1]  Allergies  Allergen Reactions   Lorazepam Nausea Only and Other (See Comments)    Other Reaction: Nightmares Other Reaction: Nightmares Other Reaction: Nightmares Other Reaction: Nightmares Other Reaction: Nightmares Other Reaction: Nightmares    Atorvastatin Other (See Comments)   Rosuvastatin Other (See Comments)   Statins Other (See Comments)    Myalgias    "

## 2024-04-27 ENCOUNTER — Ambulatory Visit: Admitting: Internal Medicine

## 2024-04-27 ENCOUNTER — Encounter: Payer: Self-pay | Admitting: Internal Medicine

## 2024-04-27 VITALS — BP 118/64 | HR 90 | Ht 60.0 in | Wt 114.8 lb

## 2024-04-27 DIAGNOSIS — F418 Other specified anxiety disorders: Secondary | ICD-10-CM

## 2024-04-27 DIAGNOSIS — K219 Gastro-esophageal reflux disease without esophagitis: Secondary | ICD-10-CM | POA: Diagnosis not present

## 2024-04-27 DIAGNOSIS — M816 Localized osteoporosis [Lequesne]: Secondary | ICD-10-CM | POA: Diagnosis not present

## 2024-04-27 DIAGNOSIS — I700 Atherosclerosis of aorta: Secondary | ICD-10-CM | POA: Diagnosis not present

## 2024-04-27 DIAGNOSIS — A6000 Herpesviral infection of urogenital system, unspecified: Secondary | ICD-10-CM | POA: Insufficient documentation

## 2024-04-27 DIAGNOSIS — N959 Unspecified menopausal and perimenopausal disorder: Secondary | ICD-10-CM | POA: Insufficient documentation

## 2024-04-27 DIAGNOSIS — Z789 Other specified health status: Secondary | ICD-10-CM

## 2024-04-27 DIAGNOSIS — I2510 Atherosclerotic heart disease of native coronary artery without angina pectoris: Secondary | ICD-10-CM | POA: Diagnosis not present

## 2024-04-27 DIAGNOSIS — H8103 Meniere's disease, bilateral: Secondary | ICD-10-CM | POA: Diagnosis not present

## 2024-04-27 DIAGNOSIS — R109 Unspecified abdominal pain: Secondary | ICD-10-CM | POA: Diagnosis not present

## 2024-04-27 DIAGNOSIS — E782 Mixed hyperlipidemia: Secondary | ICD-10-CM | POA: Diagnosis not present

## 2024-04-27 DIAGNOSIS — A6004 Herpesviral vulvovaginitis: Secondary | ICD-10-CM

## 2024-04-27 DIAGNOSIS — J4520 Mild intermittent asthma, uncomplicated: Secondary | ICD-10-CM | POA: Diagnosis not present

## 2024-04-27 NOTE — Assessment & Plan Note (Signed)
 Avoid foods that trigger reflux Continue omeprazole  20 mg daily Will monitor

## 2024-04-27 NOTE — Assessment & Plan Note (Signed)
 Not medicated She will continue to follow with ENT

## 2024-04-27 NOTE — Assessment & Plan Note (Signed)
 Continue montelukast 10 mg daily as needed and albuterol  108 mcg per actuation every 4-6 hours as needed She will continue to get allergy shots She will continue to follow with the allergist

## 2024-04-27 NOTE — Assessment & Plan Note (Signed)
 Continue valacyclovir  1000 mg twice daily as needed for outbreaks

## 2024-04-27 NOTE — Patient Instructions (Signed)
 Atherosclerosis  Atherosclerosis is when plaque builds up in the arteries. This causes narrowing and hardening of the arteries. Arteries are blood vessels that carry blood from the heart to all parts of the body. This blood contains oxygen. Plaque occurs due to inflammation or from a buildup of fat, cholesterol, calcium, waste products of cells, and a clotting material in the blood (fibrin). Plaque decreases the amount of blood that can flow through the artery. Atherosclerosis can affect any artery in your body, including: Heart arteries. Damage to these arteries may lead to coronary artery disease, which can cause a heart attack. Brain arteries. Damage to these arteries may cause a stroke. Leg, arm, and pelvis arteries. Peripheral artery disease (PAD) may result from damage to these arteries. Kidney arteries. Kidney (renal) failure may result from damage to kidney arteries. Treatment may slow the disease and prevent further damage to your heart, brain, peripheral arteries, and kidneys. What are the causes? This condition develops slowly over many years. The inner layers of your arteries become damaged and allow the gradual buildup of plaque. The exact cause of atherosclerosis is not fully understood. Symptoms of atherosclerosis do not occur until an artery becomes narrow or blocked. What increases the risk? The following factors may make you more likely to develop this condition: Being middle-aged or older. Certain medical conditions, including: High blood pressure. High cholesterol. High blood fats (triglycerides). Diabetes. Sleep apnea. Obesity. Certain lab levels, including: Elevated C-reactive protein (CRP). This is a sign of increased inflammation in your body. Elevated homocysteine levels. This is an amino acid that is associated with heart and blood vessel disease. Using tobacco or nicotine products. A family history of atherosclerosis. Not exercising enough (sedentary  lifestyle). Being stressed. Drinking too much alcohol or using drugs, such as cocaine or methamphetamine. What are the signs or symptoms? Symptoms of atherosclerosis do not occur until the plaque severely narrows or blocks the artery, which decreases blood flow. Sometimes, atherosclerosis does not cause symptoms. Symptoms of this condition include: Coronary artery disease. This may cause chest pain and shortness of breath. Decreased blood supply to your brain, which may cause a stroke. Signs of a stroke may include sudden: Weakness or numbness in your face, arm, or leg, especially on one side of your body. Trouble walking or difficulty moving your arms or legs. Loss of balance or coordination. Confusion. Slurred speech. Trouble speaking, or trouble understanding speech, or both (aphasia). Vision changes in one or both eyes. This may be double vision, blurred vision, or loss of vision. Severe headache with no known cause. The headache is often described as the worst headache ever experienced. PAD, which may cause pain, numbness, or nonhealing wounds, often in your legs and hips. Renal failure. This may cause tiredness, problems with urination, swelling, and itchy skin. How is this diagnosed? This condition is diagnosed based on your medical history and a physical exam. During the exam, your health care provider will: Check your pulse in different places. Listen for a "whooshing" sound over your arteries (bruit). You may also have tests, such as: Blood tests to check your levels of cholesterol, triglycerides, blood sugar, and CRP. Ankle-brachial index to compare blood pressure in your arms to blood pressure in your ankles to see how your blood is flowing. Heart (cardiac) tests. Electrocardiogram (ECG) to check for heart damage. Stress test to see how your heart reacts to exercise. Ultrasound tests. Ultrasound of your peripheral arteries to check blood flow. Echocardiogram to get images of  your heart's  chambers and valves. X-ray tests. Chest X-ray to see if you have an enlarged heart, which is a sign of heart failure. CT scan to check for damage to your heart, brain, or arteries. Angiogram. This is a test where dye is injected and X-rays are used to see the blood flow in the arteries. How is this treated? This condition is treated with lifestyle changes as the first step. These may include: Changing your diet. Losing weight. Reducing stress. Exercising and being physically active more regularly. Quitting smoking. You may also need medicine to: Lower triglycerides and cholesterol. Control blood pressure. Prevent blood clots. Lower inflammation in your body. Control your blood sugar. Sometimes, surgery is needed to: Remove plaque from an artery (endarterectomy). Open or widen a narrowed heart artery or peripheral artery (angioplasty). Create a new path for your blood with one of these procedures: Heart (coronary) artery bypass graft surgery. Peripheral artery bypass graft surgery. Place a small mesh tube (stent) in an artery to open or widen a narrowed artery. Follow these instructions at home: Eating and drinking  Eat a heart-healthy diet. Talk with your health care provider or a dietitian if you need help. A heart-healthy diet involves: Limiting unhealthy fats and increasing healthy fats. Some examples of healthy fats are avocados and olive oil. Eating plant-based foods, such as fruits, vegetables, nuts, whole grains, and legumes (such as peas and lentils). If you drink alcohol: Limit how much you have to: 0-1 drink a day for women who are not pregnant. 0-2 drinks a day for men. Know how much alcohol is in a drink. In the [place] one drink equals one 12 oz bottle of beer (355 mL), one 5 oz glass of wine (148 mL), or one 1 oz glass of hard liquor (44 mL). Lifestyle  Maintain a healthy weight. Lose weight if your health care provider says that you need to do  that. Follow an exercise program as told by your health care provider. Do not use any products that contain nicotine or tobacco. These products include cigarettes, chewing tobacco, and vaping devices, such as e-cigarettes. If you need help quitting, ask your health care provider. Do not use drugs. General instructions Take over-the-counter and prescription medicines only as told by your health care provider. Manage other health conditions as told. Keep all follow-up visits. This is important. Contact a health care provider if you have: An irregular heartbeat. Unexplained tiredness (fatigue). Trouble urinating, or you are producing less urine or foamy urine. Swelling of your hands or feet, or itchy skin. Unexplained pain or numbness in your legs or hips. A wound that is slow to heal or is not healing. Get help right away if: You have any symptoms of a heart attack. These may be: Chest pain. This includes squeezing chest pain that may feel like indigestion (angina). Shortness of breath. Pain in your neck, jaw, arms, back, or stomach. Cold sweat. Nausea. Light-headedness. Sudden pain, numbness, or coldness in a limb. You have any symptoms of a stroke. "BE FAST" is an easy way to remember the main warning signs of a stroke: B - Balance. Signs are dizziness, sudden trouble walking, or loss of balance. E - Eyes. Signs are trouble seeing or a sudden change in vision. F - Face. Signs are sudden weakness or numbness of the face, or the face or eyelid drooping on one side. A - Arms. Signs are weakness or numbness in an arm. This happens suddenly and usually on one side of the body. S -  Speech. Signs are sudden trouble speaking, slurred speech, or trouble understanding what people say. T - Time. Time to call emergency services. Write down what time symptoms started. You have other signs of a stroke, such as: A sudden, severe headache with no known cause. Nausea or vomiting. Seizure. These  symptoms may represent a serious problem that is an emergency. Do not wait to see if the symptoms will go away. Get medical help right away. Call your local emergency services (911 in the [place] Do not drive yourself to the hospital. Summary Atherosclerosis is when plaque builds up in the arteries and causes narrowing and hardening of the arteries. Plaque occurs due to inflammation or from a buildup of fat, cholesterol, calcium, cellular waste products, and fibrin. This condition may not cause any symptoms. Symptoms of atherosclerosis do not occur until the plaque severely narrows or blocks the artery. Treatment starts with lifestyle changes and may include medicines. In some cases, surgery is needed. Get help right away if you have any symptoms of a heart attack or stroke. This information is not intended to replace advice given to you by your health care provider. Make sure you discuss any questions you have with your health care provider. Document Revised: 07/27/2020 Document Reviewed: 07/27/2020 Elsevier Patient Education  2024 [employer].

## 2024-04-27 NOTE — Assessment & Plan Note (Signed)
 She reports she has never been diagnosed with IBS She we will continue dicyclomine  10 mg 3 times daily as needed

## 2024-04-27 NOTE — Assessment & Plan Note (Signed)
 Continue calcium and vitamin D  OTC Encouraged daily weightbearing exercise She is interested in HRT for treatment of this, she will reach out to Leita Blunt at [facility]

## 2024-04-27 NOTE — Assessment & Plan Note (Signed)
 She will reach out to Shawna Peterson at [facility] for consideration of HRT

## 2024-04-27 NOTE — Assessment & Plan Note (Signed)
 Not medicated We will monitor

## 2024-04-27 NOTE — Assessment & Plan Note (Addendum)
 Lipid profile reviewed Continue alirucamab 150 mg every 2 weeks Will discontinue ezetimibe  due to nonuse

## 2024-04-27 NOTE — Assessment & Plan Note (Signed)
 Has failed numerous statins Lipid profile reviewed Continue alirucamab 150 mg every 2 weeks Will discontinue ezetimibe  due to nonuse

## 2024-04-27 NOTE — Assessment & Plan Note (Signed)
 Lipid profile reviewed Continue alirucamab 150 mg every 2 weeks Will discontinue ezetimibe  due to nonuse Discussed starting aspirin 81 mg daily however she reports her cardiologist has not recommended this

## 2024-04-28 ENCOUNTER — Ambulatory Visit: Admitting: Family Medicine

## 2024-05-19 ENCOUNTER — Encounter: Payer: Self-pay | Admitting: Internal Medicine

## 2024-05-19 ENCOUNTER — Ambulatory Visit: Admitting: Endocrinology

## 2024-05-19 DIAGNOSIS — E782 Mixed hyperlipidemia: Secondary | ICD-10-CM

## 2024-05-19 DIAGNOSIS — M816 Localized osteoporosis [Lequesne]: Secondary | ICD-10-CM

## 2024-05-19 DIAGNOSIS — E559 Vitamin D deficiency, unspecified: Secondary | ICD-10-CM

## 2024-05-19 DIAGNOSIS — Z789 Other specified health status: Secondary | ICD-10-CM

## 2024-05-20 ENCOUNTER — Encounter: Payer: Self-pay | Admitting: Cardiovascular Disease

## 2024-05-21 ENCOUNTER — Ambulatory Visit: Admitting: "Endocrinology

## 2024-05-22 MED ORDER — REPATHA SURECLICK 140 MG/ML ~~LOC~~ SOAJ: 140.0000 mg | SUBCUTANEOUS | 3 refills | Status: AC

## 2024-05-22 NOTE — Addendum Note (Signed)
 Addended by: ANTONETTE ANGELINE ORN on: 05/22/2024 03:33 PM   Modules accepted: Orders

## 2024-05-22 NOTE — Addendum Note (Signed)
 Addended by: ZELIA GAUZE D on: 05/22/2024 03:00 PM   Modules accepted: Orders

## 2024-05-25 ENCOUNTER — Telehealth: Payer: Self-pay | Admitting: Pharmacist Clinician (PhC)/ Clinical Pharmacy Specialist

## 2024-05-25 NOTE — Telephone Encounter (Signed)
 Pharmacy could not fill Repatha  for pt, she will need a new authorization for medication. Please advise.

## 2024-05-25 NOTE — Telephone Encounter (Signed)
 Pharmacy could not fill Repatha  for pt, she will need a new authorization for Repatha . Please advise.

## 2024-05-26 ENCOUNTER — Other Ambulatory Visit (HOSPITAL_COMMUNITY): Payer: Self-pay

## 2024-05-26 ENCOUNTER — Other Ambulatory Visit

## 2024-05-26 ENCOUNTER — Telehealth: Payer: Self-pay | Admitting: Pharmacy Technician

## 2024-05-26 NOTE — Telephone Encounter (Signed)
 Pharmacy Patient Advocate Encounter   Received notification from Patient Advice Request messages that prior authorization for repatha  is required/requested.   Insurance verification completed.   The patient is insured through [employer].   Per test claim: PA required; PA submitted to above mentioned insurance via Latent Key/confirmation #/EOC [id number] Status is pending

## 2024-05-26 NOTE — Telephone Encounter (Signed)
 Rx team working on [place]

## 2024-05-27 ENCOUNTER — Other Ambulatory Visit (HOSPITAL_COMMUNITY): Payer: Self-pay

## 2024-05-27 ENCOUNTER — Other Ambulatory Visit

## 2024-05-27 DIAGNOSIS — M816 Localized osteoporosis [Lequesne]: Secondary | ICD-10-CM

## 2024-05-27 DIAGNOSIS — Z789 Other specified health status: Secondary | ICD-10-CM

## 2024-05-27 DIAGNOSIS — E782 Mixed hyperlipidemia: Secondary | ICD-10-CM

## 2024-05-27 DIAGNOSIS — E559 Vitamin D deficiency, unspecified: Secondary | ICD-10-CM

## 2024-05-27 NOTE — Telephone Encounter (Signed)
 Pharmacy Patient Advocate Encounter  Received notification from [facility] that Prior Authorization for Repatha  has been APPROVED from 05/26/24 to 05/26/25. Unable to obtain price due to refill too soon rejection, last fill date 05/26/24 next available fill date02/10/26   PA #/Case ID/Reference #: [id number]

## 2024-05-28 ENCOUNTER — Ambulatory Visit: Payer: Self-pay | Admitting: Internal Medicine

## 2024-06-01 ENCOUNTER — Other Ambulatory Visit (HOSPITAL_COMMUNITY): Payer: Self-pay

## 2024-06-01 LAB — LIPID PANEL
Cholesterol: 214 mg/dL — ABNORMAL HIGH
HDL: 113 mg/dL
LDL Cholesterol (Calc): 75 mg/dL
Non-HDL Cholesterol (Calc): 101 mg/dL
Total CHOL/HDL Ratio: 1.9 (calc)
Triglycerides: 166 mg/dL — ABNORMAL HIGH

## 2024-06-01 LAB — VITAMIN D 25 HYDROXY (VIT D DEFICIENCY, FRACTURES): Vit D, 25-Hydroxy: 63 ng/mL (ref 30–100)

## 2024-06-01 LAB — LIPOPROTEIN A (LPA): Lipoprotein (a): <10 nmol/L

## 2024-06-01 LAB — C-TERMINAL TELOPEPTIDE: C-Telopeptide (CTx): 314 pg/mL

## 2024-06-09 ENCOUNTER — Encounter: Payer: Self-pay | Admitting: Internal Medicine

## 2024-08-06 ENCOUNTER — Encounter: Admitting: Internal Medicine

## 2024-09-22 ENCOUNTER — Ambulatory Visit: Admitting: Internal Medicine

## 2024-10-08 ENCOUNTER — Ambulatory Visit

## 2024-10-16 ENCOUNTER — Encounter: Admitting: Internal Medicine

## 2025-01-12 ENCOUNTER — Ambulatory Visit: Admitting: Dermatology
